# Patient Record
Sex: Female | Born: 1955 | Race: White | Hispanic: No | Marital: Married | Smoking: Never smoker
Health system: Southern US, Community
[De-identification: ages and names within clinical notes are randomized; demographics above are authoritative.]

## PROBLEM LIST (undated history)

## (undated) DIAGNOSIS — Z8619 Personal history of other infectious and parasitic diseases: Secondary | ICD-10-CM

## (undated) DIAGNOSIS — Z9289 Personal history of other medical treatment: Secondary | ICD-10-CM

## (undated) DIAGNOSIS — Z8639 Personal history of other endocrine, nutritional and metabolic disease: Secondary | ICD-10-CM

## (undated) DIAGNOSIS — Z862 Personal history of diseases of the blood and blood-forming organs and certain disorders involving the immune mechanism: Secondary | ICD-10-CM

## (undated) DIAGNOSIS — N200 Calculus of kidney: Secondary | ICD-10-CM

## (undated) DIAGNOSIS — E039 Hypothyroidism, unspecified: Secondary | ICD-10-CM

## (undated) HISTORY — PX: ABDOMINAL HYSTERECTOMY: SHX81

## (undated) HISTORY — PX: PARTIAL HYSTERECTOMY: SHX80

## (undated) HISTORY — DX: Personal history of other medical treatment: Z92.89

## (undated) HISTORY — DX: Personal history of other endocrine, nutritional and metabolic disease: Z86.39

## (undated) HISTORY — DX: Personal history of diseases of the blood and blood-forming organs and certain disorders involving the immune mechanism: Z86.2

## (undated) HISTORY — DX: Personal history of other infectious and parasitic diseases: Z86.19

---

## 2004-06-21 ENCOUNTER — Ambulatory Visit: Payer: Self-pay | Admitting: Family Medicine

## 2005-03-24 ENCOUNTER — Ambulatory Visit: Payer: Self-pay | Admitting: Family Medicine

## 2005-03-24 ENCOUNTER — Encounter: Payer: Self-pay | Admitting: Family Medicine

## 2005-03-24 ENCOUNTER — Other Ambulatory Visit: Admission: RE | Admit: 2005-03-24 | Discharge: 2005-03-24 | Payer: Self-pay | Admitting: Family Medicine

## 2005-03-24 LAB — CONVERTED CEMR LAB: Pap Smear: NORMAL

## 2005-04-08 ENCOUNTER — Ambulatory Visit: Payer: Self-pay | Admitting: Family Medicine

## 2005-06-21 ENCOUNTER — Ambulatory Visit: Payer: Self-pay | Admitting: Family Medicine

## 2005-08-02 ENCOUNTER — Ambulatory Visit: Payer: Self-pay | Admitting: Family Medicine

## 2005-08-05 ENCOUNTER — Ambulatory Visit: Payer: Self-pay | Admitting: Family Medicine

## 2005-08-08 ENCOUNTER — Ambulatory Visit: Payer: Self-pay | Admitting: Family Medicine

## 2006-04-13 ENCOUNTER — Ambulatory Visit: Payer: Self-pay | Admitting: Family Medicine

## 2006-04-13 LAB — CONVERTED CEMR LAB
Basophils Relative: 1.3 % — ABNORMAL HIGH (ref 0.0–1.0)
CO2: 31 meq/L (ref 19–32)
Chloride: 106 meq/L (ref 96–112)
Cholesterol: 267 mg/dL (ref 0–200)
Creatinine, Ser: 1.1 mg/dL (ref 0.4–1.2)
Direct LDL: 173 mg/dL
Eosinophils Relative: 1.4 % (ref 0.0–5.0)
Glucose, Bld: 87 mg/dL (ref 70–99)
HCT: 36.3 % (ref 36.0–46.0)
Hemoglobin: 12.2 g/dL (ref 12.0–15.0)
Lymphocytes Relative: 31.5 % (ref 12.0–46.0)
Monocytes Absolute: 0.4 10*3/uL (ref 0.2–0.7)
Monocytes Relative: 6.9 % (ref 3.0–11.0)
Neutro Abs: 2.9 10*3/uL (ref 1.4–7.7)
Neutrophils Relative %: 58.9 % (ref 43.0–77.0)
Phosphorus: 3.7 mg/dL (ref 2.3–4.6)
Sodium: 140 meq/L (ref 135–145)
TSH: >100 microintl units/mL — ABNORMAL HIGH (ref 0.35–5.50)
VLDL: 33 mg/dL (ref 0–40)
WBC: 5.1 10*3/uL (ref 4.5–10.5)

## 2006-07-06 ENCOUNTER — Encounter: Payer: Self-pay | Admitting: Family Medicine

## 2006-07-06 DIAGNOSIS — E039 Hypothyroidism, unspecified: Secondary | ICD-10-CM | POA: Insufficient documentation

## 2006-07-06 DIAGNOSIS — E785 Hyperlipidemia, unspecified: Secondary | ICD-10-CM | POA: Insufficient documentation

## 2006-12-29 ENCOUNTER — Telehealth: Payer: Self-pay | Admitting: Family Medicine

## 2007-02-27 ENCOUNTER — Ambulatory Visit: Payer: Self-pay | Admitting: Family Medicine

## 2007-03-23 ENCOUNTER — Telehealth: Payer: Self-pay | Admitting: Family Medicine

## 2007-05-01 ENCOUNTER — Telehealth: Payer: Self-pay | Admitting: Family Medicine

## 2007-08-10 ENCOUNTER — Telehealth: Payer: Self-pay | Admitting: Family Medicine

## 2007-12-28 ENCOUNTER — Ambulatory Visit: Payer: Self-pay | Admitting: Family Medicine

## 2007-12-31 LAB — CONVERTED CEMR LAB
HDL: 60.8 mg/dL (ref >39.0)
Triglycerides: 68 mg/dL (ref 0–149)

## 2009-03-11 ENCOUNTER — Telehealth: Payer: Self-pay | Admitting: Family Medicine

## 2009-04-10 ENCOUNTER — Telehealth: Payer: Self-pay | Admitting: Family Medicine

## 2009-04-20 ENCOUNTER — Other Ambulatory Visit: Admission: RE | Admit: 2009-04-20 | Discharge: 2009-04-20 | Payer: Self-pay | Admitting: Family Medicine

## 2009-04-20 ENCOUNTER — Ambulatory Visit: Payer: Self-pay | Admitting: Family Medicine

## 2009-04-22 ENCOUNTER — Encounter (INDEPENDENT_AMBULATORY_CARE_PROVIDER_SITE_OTHER): Payer: Self-pay | Admitting: *Deleted

## 2009-04-22 LAB — CONVERTED CEMR LAB
ALT: 25 units/L (ref 0–35)
BUN: 13 mg/dL (ref 6–23)
Basophils Relative: 1.2 % (ref 0.0–3.0)
Bilirubin, Direct: 0.1 mg/dL (ref 0.0–0.3)
CO2: 30 meq/L (ref 19–32)
Calcium: 9.1 mg/dL (ref 8.4–10.5)
Chloride: 105 meq/L (ref 96–112)
Cholesterol: 179 mg/dL (ref 0–200)
Creatinine, Ser: 0.6 mg/dL (ref 0.4–1.2)
Eosinophils Relative: 2.3 % (ref 0.0–5.0)
HDL: 45.6 mg/dL (ref >39.00)
LDL Cholesterol: 113 mg/dL — ABNORMAL HIGH (ref 0–99)
MCV: 83.2 fL (ref 78.0–100.0)
Monocytes Relative: 5.7 % (ref 3.0–12.0)
Neutrophils Relative %: 67.4 % (ref 43.0–77.0)
Platelets: 334 10*3/uL (ref 150.0–400.0)
RBC: 4.07 M/uL (ref 3.87–5.11)
TSH: 0.03 microintl units/mL — ABNORMAL LOW (ref 0.35–5.50)
Total Bilirubin: 0.3 mg/dL (ref 0.3–1.2)
Total CHOL/HDL Ratio: 4
Total Protein: 6.7 g/dL (ref 6.0–8.3)
Triglycerides: 103 mg/dL (ref 0.0–149.0)
WBC: 6.6 10*3/uL (ref 4.5–10.5)

## 2009-04-29 ENCOUNTER — Ambulatory Visit: Payer: Self-pay | Admitting: Family Medicine

## 2009-05-01 ENCOUNTER — Encounter (INDEPENDENT_AMBULATORY_CARE_PROVIDER_SITE_OTHER): Payer: Self-pay | Admitting: *Deleted

## 2009-05-01 LAB — CONVERTED CEMR LAB
Fecal Occult Bld: NEGATIVE
Pap Smear: NEGATIVE

## 2009-05-26 ENCOUNTER — Ambulatory Visit: Payer: Self-pay | Admitting: Family Medicine

## 2009-05-26 ENCOUNTER — Encounter: Payer: Self-pay | Admitting: Family Medicine

## 2009-06-01 ENCOUNTER — Encounter (INDEPENDENT_AMBULATORY_CARE_PROVIDER_SITE_OTHER): Payer: Self-pay | Admitting: *Deleted

## 2009-07-13 ENCOUNTER — Ambulatory Visit: Payer: Self-pay | Admitting: Family Medicine

## 2009-07-13 LAB — CONVERTED CEMR LAB: Retic Ct Pct: 0.5 % (ref 0.4–3.1)

## 2009-07-15 ENCOUNTER — Encounter: Payer: Self-pay | Admitting: Family Medicine

## 2009-07-15 LAB — CONVERTED CEMR LAB
AST: 36 units/L (ref 0–37)
Albumin: 4.3 g/dL (ref 3.5–5.2)
Alkaline Phosphatase: 112 units/L (ref 39–117)
Basophils Absolute: 0 10*3/uL (ref 0.0–0.1)
Basophils Relative: 0.3 % (ref 0.0–3.0)
Eosinophils Absolute: 0.2 10*3/uL (ref 0.0–0.7)
Folate: 16.7 ng/mL
Iron: 118 ug/dL (ref 42–145)
Lymphocytes Relative: 29.3 % (ref 12.0–46.0)
MCHC: 33.7 g/dL (ref 30.0–36.0)
Monocytes Relative: 7.1 % (ref 3.0–12.0)
Neutrophils Relative %: 59.6 % (ref 43.0–77.0)
RBC: 4.33 M/uL (ref 3.87–5.11)
RDW: 14.5 % (ref 11.5–14.6)
Total Protein: 6.6 g/dL (ref 6.0–8.3)
Vitamin B-12: 850 pg/mL (ref 211–911)

## 2010-03-18 ENCOUNTER — Ambulatory Visit
Admission: RE | Admit: 2010-03-18 | Discharge: 2010-03-18 | Payer: Self-pay | Source: Home / Self Care | Attending: Family Medicine | Admitting: Family Medicine

## 2010-03-22 LAB — CONVERTED CEMR LAB: TSH: 0.02 microintl units/mL — ABNORMAL LOW (ref 0.35–5.50)

## 2010-04-13 ENCOUNTER — Ambulatory Visit
Admission: RE | Admit: 2010-04-13 | Discharge: 2010-04-13 | Payer: Self-pay | Source: Home / Self Care | Attending: Family Medicine | Admitting: Family Medicine

## 2010-04-13 ENCOUNTER — Other Ambulatory Visit: Payer: Self-pay | Admitting: Family Medicine

## 2010-04-13 DIAGNOSIS — R197 Diarrhea, unspecified: Secondary | ICD-10-CM | POA: Insufficient documentation

## 2010-04-13 LAB — CBC WITH DIFFERENTIAL/PLATELET
Basophils Relative: 0.7 % (ref 0.0–3.0)
Eosinophils Absolute: 0.2 10*3/uL (ref 0.0–0.7)
Lymphs Abs: 2 10*3/uL (ref 0.7–4.0)
MCHC: 33.3 g/dL (ref 30.0–36.0)
MCV: 85.3 fl (ref 78.0–100.0)
Monocytes Absolute: 0.8 10*3/uL (ref 0.1–1.0)
Neutrophils Relative %: 62.6 % (ref 43.0–77.0)
Platelets: 341 10*3/uL (ref 150.0–400.0)
RBC: 4.26 Mil/uL (ref 3.87–5.11)

## 2010-04-13 LAB — BASIC METABOLIC PANEL
BUN: 17 mg/dL (ref 6–23)
CO2: 30 mEq/L (ref 19–32)
Chloride: 106 mEq/L (ref 96–112)
Creatinine, Ser: 0.8 mg/dL (ref 0.4–1.2)

## 2010-04-14 ENCOUNTER — Encounter: Payer: Self-pay | Admitting: Family Medicine

## 2010-04-20 ENCOUNTER — Telehealth: Payer: Self-pay | Admitting: Family Medicine

## 2010-04-20 NOTE — Miscellaneous (Signed)
  Clinical Lists Changes  Observations: Added new observation of MAMMO DUE: 05/27/2010 (05/26/2009 14:24) Added new observation of MAMMOGRAM: Normal (05/26/2009 14:24)

## 2010-04-20 NOTE — Assessment & Plan Note (Signed)
Summary: CPX/CLE   Vital Signs:  Patient profile:   55 year old female Height:      63 inches Weight:      121 pounds BMI:     21.51 Temp:     97.9 degrees F oral Pulse rate:   76 / minute Pulse rhythm:   regular BP sitting:   110 / 68  (left arm) Cuff size:   regular  Vitals Entered By: Lowella Petties CMA (April 20, 2009 2:39 PM) CC: 30 minute check up   History of Present Illness: here for health mt exam and to follow chronic med problems  is doing well overall   had a bad cold -- did get over it   wt is up 5 lb with bmi of 21  thyroid is ok  feels like that is just fine without change   lipids due for check Last Lipid ProfileCholesterol: 233 (12/28/2007 9:44:00 AM)HDL:  60.8 (12/28/2007 9:44:00 AM)LDL:  DEL (12/28/2007 9:44:00 AM)Triglycerides:  Last Liver profileSGOT:  SPGT:  T. Bili:  Alk Phos:     diet -- is not great -- is good with vegetables -- too much fast food , knows she needs to eat less   mamd 5/07 due for it no changes on self exam  pap 07 partial hyst for bleeding in past  no gyn symptoms at all   stool cards in 07  not interested in colonoscopy  Td 05  does not get a flu shot   is fairly good with exercise most of the time-- very physical job    Allergies: No Known Drug Allergies  Past History:  Past Medical History: Last updated: 07/06/2006 Hypothyroidism Hyperlipidemia hep A as a child  Past Surgical History: Last updated: 07/06/2006 hyst. part. 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 Eyes:   Denies blurring and eye pain. CV:  Denies chest pain or discomfort, lightheadness, and palpitations. Resp:  Denies cough and wheezing. GI:  Denies abdominal pain, bloody stools, change in bowel habits, and indigestion. GU:  Denies abnormal vaginal bleeding, discharge, dysuria, and hematuria. MS:  Denies joint pain and low back pain. Derm:  Denies itching, lesion(s), poor wound healing, and rash. Neuro:  Denies numbness and tingling. Psych:  Denies anxiety and depression. Endo:  Denies cold intolerance, excessive thirst, excessive urination, and heat intolerance. Heme:  Denies abnormal bruising and bleeding.  Physical Exam  General:  Well-developed,well-nourished,in no acute distress; alert,appropriate and cooperative throughout examination Head:  normocephalic, atraumatic, and no abnormalities observed.   Eyes:  vision grossly intact, pupils equal, pupils round, and pupils reactive to light.  no conjunctival pallor, injection or icterus  Ears:  R ear normal and L ear normal.   Nose:  no nasal discharge.   Mouth:  pharynx pink and moist.   Neck:  supple with full rom and no masses or thyromegally, no JVD or carotid bruit  Chest Wall:  No deformities, masses, or tenderness noted. Breasts:  No mass, nodules, thickening, tenderness, bulging, retraction, inflamation, nipple discharge or skin changes noted.   Lungs:  Normal respiratory effort, chest expands symmetrically. Lungs are clear to  auscultation, no crackles or wheezes. Heart:  Normal rate and regular rhythm. S1 and S2 normal without gallop, murmur, click, rub or other extra sounds. Abdomen:  Bowel sounds positive,abdomen soft and non-tender without masses, organomegaly or hernias noted. no renal bruits  Genitalia:  normal introitus, no external lesions, no vaginal discharge, mucosa pink and moist, no vaginal or cervical lesions, and no friaility or hemorrhage.  uterus is surgically absent  Msk:  No deformity or scoliosis noted of  thoracic or lumbar spine.  no acute joint changes Pulses:  R and L carotid,radial,femoral,dorsalis pedis and posterior tibial pulses are full and equal bilaterally Extremities:  No clubbing, cyanosis, edema, or deformity noted with normal full range of motion of all joints.   Neurologic:  sensation intact to light touch, gait normal, and DTRs symmetrical and normal.   Skin:  Intact without suspicious lesions or rashes Cervical Nodes:  No lymphadenopathy noted Axillary Nodes:  No palpable lymphadenopathy Inguinal Nodes:  No significant adenopathy Psych:  normal affect, talkative and pleasant    Impression & Recommendations:  Problem # 1:  HEALTH MAINTENANCE EXAM (ICD-V70.0) Assessment Comment Only reviewed health habits including diet, exercise and skin cancer prevention reviewed health maintenance list and family history lab today Orders: TLB-Lipid Panel (80061-LIPID) TLB-BMP (Basic Metabolic Panel-BMET) (80048-METABOL) TLB-Hepatic/Liver Function Pnl (80076-HEPATIC) TLB-CBC Platelet - w/Differential (85025-CBCD)  Problem # 2:  ROUTINE GYNECOLOGICAL EXAMINATION (ICD-V72.31) Assessment: Comment Only exam done/ staus post hyst  if nl - needs no further pap smears   Problem # 3:  OTHER SCREENING MAMMOGRAM (ICD-V76.12) Assessment: Comment Only annual mammogram scheduled adv pt to continue regular self breast exams non remarkable breast exam today  Orders: Radiology Referral (Radiology)  Problem # 4:  HYPOTHYROIDISM (ICD-244.9) Assessment: Unchanged  no clinical changes med refil  lab today and adv  Her updated medication list for this problem includes:    Synthroid 150 Mcg Tabs (Levothyroxine sodium) ..... One by mouth daily  Orders: TLB-Lipid Panel (80061-LIPID) TLB-BMP (Basic Metabolic Panel-BMET) (80048-METABOL) TLB-Hepatic/Liver Function Pnl (80076-HEPATIC) TLB-TSH (Thyroid Stimulating Hormone) (84443-TSH) TLB-CBC Platelet - w/Differential (85025-CBCD) Radiology  Referral (Radiology)  Labs Reviewed: TSH: 5.00 (12/28/2007)    Chol: 233 (12/28/2007)   HDL: 60.8 (12/28/2007)   LDL: DEL (12/28/2007)   TG: 68 (12/28/2007)  Problem # 5:  HYPERLIPIDEMIA (ICD-272.4) Assessment: Unchanged  disc lower sat fat diet and risks of high chol lab today and adv  Orders: TLB-Lipid Panel (80061-LIPID) TLB-BMP (Basic Metabolic Panel-BMET) (80048-METABOL) TLB-Hepatic/Liver Function Pnl (80076-HEPATIC) TLB-CBC Platelet - w/Differential (85025-CBCD)    HDL:60.8 (12/28/2007), 50.6 (04/13/2006)  LDL:DEL (12/28/2007), DEL (04/13/2006)  Chol:233 (12/28/2007), 267 (04/13/2006)  Trig:68 (12/28/2007), 167 (04/13/2006)  Problem # 6:  SPECIAL SCREENING FOR OSTEOPOROSIS (ICD-V82.81) Assessment: Comment Only pt is hypothyroid and post men sched dexa rev rec for ca and D Orders: Radiology Referral (Radiology)  Complete Medication List: 1)  Multivitamins Tabs (Multiple vitamin) .... One by mouth daily 2)  Synthroid 150 Mcg Tabs (Levothyroxine sodium) .... One by mouth daily  Patient Instructions: 1)  we will schedule mammogram at check out / also bone density test  2)  please do stool card for colon cancer screening  3)  you can raise your HDL (good cholesterol) by increasing exercise and eating omega 3 fatty acid supplement like fish oil or flax seed oil over the counter 4)  you can lower LDL (bad cholesterol) by limiting saturated fats in diet like red meat, fried foods, egg yolks, fatty breakfast meats, high fat dairy products and  shellfish  5)  labs today  6)  the current recommendation for calcium intake is 1200-1500 mg daily with 1000 IU of vitamin D  Prescriptions: SYNTHROID 150 MCG TABS (LEVOTHYROXINE SODIUM) one by mouth daily  #90 x 3   Entered and Authorized by:   Judith Part MD   Signed by:   Judith Part MD on 04/20/2009   Method used:   Electronically to        [facility] [address] (retail)       [address]        [place], [place]  [address]       Ph: [phone]       Fax: [phone]   RxID:   [id number]   Prior Medications (reviewed today): MULTIVITAMINS  TABS (MULTIPLE VITAMIN) one by mouth daily SYNTHROID 150 MCG TABS (LEVOTHYROXINE SODIUM) one by mouth daily Current Allergies: No known allergies

## 2010-04-20 NOTE — Miscellaneous (Signed)
Summary: med list update- synthroid  Clinical Lists Changes  Medications: Changed medication from SYNTHROID 150 MCG TABS (LEVOTHYROXINE SODIUM) one by mouth daily to SYNTHROID 125 MCG TABS (LEVOTHYROXINE SODIUM) take one by mouth daily     Prior Medications: MULTIVITAMINS  TABS (MULTIPLE VITAMIN) one by mouth daily SYNTHROID 125 MCG TABS (LEVOTHYROXINE SODIUM) take one by mouth daily Current Allergies: No known allergies

## 2010-04-20 NOTE — Progress Notes (Signed)
Summary: cough, achy, wheezing   Phone Note Call from Patient Call back at [phone]   Caller: Patient Call For: Judith Part MD Summary of Call: Pt has productive cough with green mucus, some wheezing but no trouble getting her breath. Pt is very achy in afternoon for 2 weeks and has been taking Aleve. Pt not sure if fever and has drainage at back of throat but no sorethroat. Pt has appt for CPX on 04/20/09 schedueld with Dr.Chyann Ambrocio and would rather not come in unless absolutely necessary. Pt would like med called in to [facility] [phone]. Please advise.  Initial call taken by: Lewanda Rife LPN,  April 10, 2009 11:13 AM  Follow-up for Phone Call        update me if high fever or sob I recommend mucinex DM for cough/ nasal saline spray and lots of fluids  if worse- give inst to get into sat clinic  Follow-up by: Judith Part MD,  April 10, 2009 12:11 PM  Additional Follow-up for Phone Call Additional follow up Details #1::        Advised pt. Additional Follow-up by: Lowella Petties CMA,  April 10, 2009 12:13 PM

## 2010-04-20 NOTE — Miscellaneous (Signed)
Summary: Synthroid rx  Clinical Lists Changes  Medications: Added new medication of SYNTHROID 125 MCG TABS (LEVOTHYROXINE SODIUM) Take 1 tablet by mouth once a day - Signed Removed medication of SYNTHROID 150 MCG TABS (LEVOTHYROXINE SODIUM) Take 1 tablet by mouth once a day Rx of SYNTHROID 125 MCG TABS (LEVOTHYROXINE SODIUM) Take 1 tablet by mouth once a day;  #30 x 3;  Signed;  Entered by: Lewanda Rife LPN;  Authorized by: Judith Part MD;  Method used: Electronically to [facility] [address], [place], [place], [place]  [address], Ph: [phone], Fax: [phone]    Prescriptions: SYNTHROID 125 MCG TABS (LEVOTHYROXINE SODIUM) Take 1 tablet by mouth once a day  #30 x 3   Entered by:   Lewanda Rife LPN   Authorized by:   Judith Part MD   Signed by:   Lewanda Rife LPN on 39/76/7341   Method used:   Electronically to        [facility] [address] (retail)       [address]       [place], [place]  [address]       Ph: [phone]       Fax: [phone]   RxID:   [id number]  Pt will call back to schedule lab  in 6 weeks.Lewanda Rife LPN  July 15, 2009 4:35 PM  Current Allergies: No known allergies

## 2010-04-20 NOTE — Letter (Signed)
Summary: Results Follow up Letter  [place] at [facility]  [address] [place], [place] [address]   Phone: [phone]  Fax: [phone]    05/01/2009 MRN: [id number]    [facility] [address] [place], [place]  [address]    Dear Ms. Battisti,  The following are the results of your recent test(s):  Test         Result    Pap Smear:        Normal _____  Not Normal _____ Comments: ______________________________________________________ Cholesterol: LDL(Bad cholesterol):         Your goal is less than:         HDL (Good cholesterol):       Your goal is more than: Comments:  ______________________________________________________ Mammogram:        Normal _____  Not Normal _____ Comments:  ___________________________________________________________________ Hemoccult:        Normal __X___  Not normal _______ Comments:     We will discuss this further at your follow up visit.  _____________________________________________________________________ Other Tests:    We routinely do not discuss normal results over the telephone.  If you desire a copy of the results, or you have any questions about this information we can discuss them at your next office visit.   Sincerely,    Marne A. Milinda Antis, M.D.  MAT:lsf

## 2010-04-20 NOTE — Letter (Signed)
Summary: Results Follow up Letter  [place] at [facility]  [address] [place], [place] [address]   Phone: [phone]  Fax: [phone]    05/01/2009 MRN: [id number]    [facility] [address] [place], [place]  [address]    Dear Ms. Gillian,  The following are the results of your recent test(s):  Test         Result    Pap Smear:        Normal __X___  Not Normal _____ Comments: ______________________________________________________ Cholesterol: LDL(Bad cholesterol):         Your goal is less than:         HDL (Good cholesterol):       Your goal is more than: Comments:  ______________________________________________________ Mammogram:        Normal _____  Not Normal _____ Comments:  ___________________________________________________________________ Hemoccult:        Normal _____  Not normal _______ Comments:    _____________________________________________________________________ Other Tests:    We routinely do not discuss normal results over the telephone.  If you desire a copy of the results, or you have any questions about this information we can discuss them at your next office visit.   Sincerely,    Marne A. Milinda Antis, M.D.  MAT:lsf

## 2010-04-20 NOTE — Letter (Signed)
Summary: Results Follow up Letter  [place] at [facility]  [address] [place], [place] [address]   Phone: [phone]  Fax: [phone]    06/01/2009 MRN: [id number]    [facility] [address] [place], [place]  [address]    Dear Ms. Oneil,  The following are the results of your recent test(s):  Test         Result    Pap Smear:        Normal _____  Not Normal _____ Comments: ______________________________________________________ Cholesterol: LDL(Bad cholesterol):         Your goal is less than:         HDL (Good cholesterol):       Your goal is more than: Comments:  ______________________________________________________ Mammogram:        Normal __X___  Not Normal _____ Comments:  Yearly follow up is recommended.   ___________________________________________________________________ Hemoccult:        Normal _____  Not normal _______ Comments:    _____________________________________________________________________ Other Tests:    We routinely do not discuss normal results over the telephone.  If you desire a copy of the results, or you have any questions about this information we can discuss them at your next office visit.   Sincerely,    Marne A. Milinda Antis, M.D.  MAT:lsf

## 2010-04-20 NOTE — Letter (Signed)
Summary: [facility]: Immunoassay Fecal Occult Blood (iFOB) Order Form  [place] at [facility]  [address] [place], [place] [address]   Phone: [phone]  Fax: [phone]      [facility]: Immunoassay Fecal Occult Blood (iFOB) Order Form   April 20, 2009 MRN: [id number]   CEONNA FRAZZINI 1955-11-04   Physicican Name:___Tower______________________  Diagnosis Code:_____V76.49_____________________      Judith Part MD

## 2010-04-20 NOTE — Assessment & Plan Note (Signed)
Summary: F/U/CLE   Vital Signs:  Patient profile:   55 year old female Weight:      126.50 pounds BMI:     22.49 Temp:     98.2 degrees F oral Pulse rate:   80 / minute Pulse rhythm:   regular BP sitting:   104 / 70  (left arm) Cuff size:   regular  Vitals Entered By: Lewanda Rife LPN (July 13, 2009 3:21 PM) CC: follow up   History of Present Illness: here for f/u of hypothyroid and lipid and anemia   tsh was low in jan --- adv to dec dose and pt did not -- also did not come for 6 wk f/u  needs this checked she is feeling a little more tired than usual  no racing heart or wt loss    chol was fairly good with  LDL 113  mildly anemic with hb 11.3  used to be anemic -- was when pregnant  no menses - due to hyst -- did have to have transfusion with her hyst  has not had colonosc - yet  has been taking iron  does not donate blood   no dietary restrictions   is starting to walk again-- and did 5 K run in march     Allergies (verified): No Known Drug Allergies  Past History:  Past Surgical History: Last updated: 07/06/2006 hyst. part. 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 Eyes:  Denies blurring and eye irritation. CV:  Denies chest pain or  discomfort, palpitations, shortness of breath with exertion, and swelling of feet. Resp:  Denies cough and wheezing. GI:  Denies abdominal pain, bloody stools, change in bowel habits, indigestion, and nausea. GU:  Denies dysuria and urinary frequency. MS:  arms are achey at night knees sometimes hurt after running . Derm:  Denies lesion(s), poor wound healing, and rash. Neuro:  Denies numbness, tingling, and weakness. Psych:  Denies anxiety and depression. Endo:  Denies cold intolerance, excessive thirst, excessive urination, and heat intolerance. Heme:  Denies abnormal bruising and bleeding.  Physical Exam  General:  Well-developed,well-nourished,in no acute distress; alert,appropriate and cooperative throughout examination Head:  normocephalic, atraumatic, and no abnormalities observed.   Eyes:  vision grossly intact, pupils equal, pupils round, and pupils reactive to light.  no conjunctival pallor, injection or icterus  Mouth:  pharynx pink and moist.   Neck:  supple with full rom and no masses or thyromegally, no JVD or carotid bruit  Lungs:  Normal respiratory effort, chest expands symmetrically. Lungs are clear to auscultation, no crackles or wheezes. Heart:  Normal rate and regular rhythm. S1 and S2 normal without gallop, murmur, click, rub or other extra sounds. Msk:  No deformity or scoliosis noted of thoracic or lumbar spine.  no acute joint changes  Extremities:  No clubbing, cyanosis, edema, or deformity noted with normal  full range of motion of all joints.   Neurologic:  sensation intact to light touch, gait normal, and DTRs symmetrical and normal.  no tremor  Skin:  Intact without suspicious lesions or rashes Cervical Nodes:  No lymphadenopathy noted Psych:  nl affect  no pressured speech or signs of anx   Impression & Recommendations:  Problem # 1:  UNSPECIFIED ANEMIA (ICD-285.9) Assessment New pt is strongly resistant to screen colonoscopy  given 3 heme cards  (immunoassay in past nl ) has intermittent hx of anemia  labs today and adv  Orders: Venipuncture (32951) TLB-CBC Platelet - w/Differential (85025-CBCD) TLB-TSH (Thyroid Stimulating Hormone) (84443-TSH) TLB-B12 + Folate Pnl (88416_60630-Z60/FUX) TLB-IBC Pnl (Iron/FE;Transferrin) (83550-IBC) TLB-Ferritin (82728-FER) T-Reticulocyte Count, Automated (32355-73220) TLB-Hepatic/Liver Function Pnl (80076-HEPATIC) Specimen Handling (25427)  Problem # 2:  HYPOTHYROIDISM (ICD-244.9) Assessment: Deteriorated  pt did not dec dose as inst  lab today -- then will adv CW:CBJS change overall feels a little tired -but no other clinical changes  The following medications were removed from the medication list:    Synthroid 125 Mcg Tabs (Levothyroxine sodium) .Marland Kitchen... Take one by mouth daily Her updated medication list for this problem includes:    Synthroid 150 Mcg Tabs (Levothyroxine sodium) .Marland Kitchen... Take 1 tablet by mouth once a day  Orders: Venipuncture (28[address]) TLB-CBC Platelet - w/Differential (85025-CBCD) TLB-TSH (Thyroid Stimulating Hormone) (84443-TSH) TLB-B12 + Folate Pnl (17616_07371-G62/IRS) TLB-IBC Pnl (Iron/FE;Transferrin) (83550-IBC) TLB-Ferritin (82728-FER) T-Reticulocyte Count, Automated (85462-70350)  Labs Reviewed: TSH: 0.03 (04/20/2009)    Chol: 179 (04/20/2009)   HDL: 45.60 (04/20/2009)   LDL: 113 (04/20/2009)   TG: 103.0 (04/20/2009)  Complete Medication List: 1)  Multivitamins Tabs (Multiple vitamin) .... One by mouth daily 2)  Calcium-vitamin D 500-125 Mg-unit Tabs (Calcium-vitamin d) .... Take 1 tablet by mouth once a day 3)  Potassium 99 Mg Tabs (Potassium) .... Take 1 tablet by mouth once a day (otc) 4)  Synthroid 150 Mcg Tabs (Levothyroxine sodium) .... Take 1 tablet by mouth once a day  Patient Instructions: 1)  labs today 2)  I will advise you further in terms of medications when I get them back  3)  no iron supplement for now  4)  do a set of 3 heme cards  5)  let  me know when you are ready for screening colonoscopy   Current Allergies (reviewed today): No known allergies

## 2010-04-22 NOTE — Assessment & Plan Note (Signed)
Summary: diarrhea x 4 weeks/alc   Vital Signs:  Patient profile:   55 year old female Height:      63 inches Weight:      113.75 pounds BMI:     20.22 Temp:     98.1 degrees F oral Pulse rate:   80 / minute Pulse rhythm:   regular BP sitting:   100 / 64  (right arm) Cuff size:   regular  Vitals Entered By: Lewanda Rife LPN (April 13, 2010 8:15 AM) CC: diarrhea x 4 wks and pt feels weak.    History of Present Illness: here for diarrhea- for about 4 weeks  has urgency to have bm right after she eats  some muscle cramping  stool is really watery  some hemorroidal bleeding -- little with wiping  is having 8-10 bm per day is under some stress - not much more than usual no diet change except occ protien shake afraid to eat because of diarrhea  2 episodes of nausea - did not throw up  no abd pain  not a lot of abd cramping  no fever   nothing out of the ordinary for diet -- ? what she last ate  had zpack before x mas  wt is down 13lb does go to the gym and was thinking about a body building show- was trying to loose wt   tsh was low on 12/29       Allergies (verified): No Known Drug Allergies  Past History:  Past Medical History: Last updated: 07/13/2009 Hypothyroidism Hyperlipidemia hep A as a child anemia  blood transfusion in past   Past Surgical History: Last updated: 07/06/2006 hyst. part. 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 Eyes:   Denies blurring. CV:  Denies chest pain or discomfort, lightheadness, palpitations, and shortness of breath with exertion. Resp:  Denies cough and shortness of breath. GI:  Complains of change in bowel habits, diarrhea, gas, and hemorrhoids; denies indigestion and vomiting. GU:  Denies dysuria and urinary frequency. Derm:  Denies itching, lesion(s), poor wound healing, and rash. Neuro:  Denies numbness and tingling. Psych:  is stressed . Endo:  Denies cold intolerance, excessive thirst, excessive urination, and heat intolerance. Heme:  Denies abnormal bruising and bleeding.  Physical Exam  General:  slim and well appearing - wt loss noted  Head:  normocephalic, atraumatic, and no abnormalities observed.   Eyes:  vision grossly intact, pupils equal, pupils round, and pupils reactive to light.  no conjunctival pallor, injection or icterus  Mouth:  pharynx pink and moist.   Neck:  supple with full rom and no masses or thyromegally, no JVD or carotid bruit  Lungs:  Normal respiratory effort, chest expands symmetrically. Lungs are clear to auscultation, no crackles or wheezes. Heart:  Normal rate and regular rhythm. S1 and S2 normal without gallop, murmur, click, rub or other extra sounds. Abdomen:  Bowel sounds positive,abdomen soft and non-tender without masses, organomegaly or hernias noted. Msk:  No deformity or scoliosis noted of thoracic or lumbar spine.  no acute joint changes  Extremities:  No clubbing, cyanosis, edema, or deformity noted with normal full range of motion of all joints.   Neurologic:  gait normal and DTRs symmetrical and normal.   no tremor  Skin:  Intact without suspicious lesions or rashes nl color and turgor (is tanned )  Cervical Nodes:  No lymphadenopathy noted Inguinal Nodes:  No significant adenopathy Psych:  normal affect, talkative and pleasant    Impression & Recommendations:  Problem # 1:  DIARRHEA (ICD-787.91) Assessment New for 4 weeks with urgency  of stools/ watery and wt loss  did have on round of zpak at holidays- test for cdiff also stool cx bmet-- check electrolytes cbc with diff also tsh -- ? over suppl thyroid causing this and wt loss pt not ready for screen colonosc yet but would consider if she had all neg labs also disc poss of IBS will update if worse- pend labs Her updated medication list for this problem includes:    Imodium Advanced 2-125 Mg Tabs (Loperamide-simethicone) ..... Otc as directed.  Orders: Venipuncture (81191) TLB-BMP (Basic Metabolic Panel-BMET) (80048-METABOL) TLB-CBC Platelet - w/Differential (85025-CBCD) T-Culture, C-Diff Toxin A/B (47829-56213) T-Culture, Stool (87045/[id number]) TLB-TSH (Thyroid Stimulating Hormone) (84443-TSH) Specimen Handling (08657)  Problem # 2:  HYPOTHYROIDISM (ICD-244.9) Assessment: Unchanged  has had low tsh -- and recent red in dose re check today could cause diarrhea if over suppl Her updated medication list for this problem includes:    Synthroid 88 Mcg Tabs (Levothyroxine sodium) .Marland Kitchen... 1 by mouth once daily  Labs Reviewed: TSH: 0.02 (03/18/2010)    Chol: 179 (04/20/2009)   HDL: 45.60 (04/20/2009)   LDL: 113 (04/20/2009)   TG: 103.0 (04/20/2009)  Orders: Venipuncture (84696) TLB-BMP (Basic Metabolic Panel-BMET) (80048-METABOL) TLB-CBC Platelet - w/Differential (85025-CBCD) T-Culture, C-Diff Toxin A/B (29528-41324) T-Culture, Stool (87045/87046-70140) TLB-TSH (Thyroid Stimulating Hormone) (84443-TSH)  Complete Medication List: 1)  Multivitamins Tabs (Multiple vitamin) .... One by mouth daily 2)  Calcium-vitamin D 500-125 Mg-unit Tabs (Calcium-vitamin d) .... Take 1 tablet by mouth once a day 3)  Potassium 99 Mg Tabs (Potassium) .... Take 1 tablet by mouth once a day (otc) 4)  Synthroid 88 Mcg Tabs (Levothyroxine sodium) .Marland Kitchen.. 1 by mouth once daily 5)  Niacin 100 Mg Tabs (Niacin) .... Take 1 tablet by mouth once a day 6)  Imodium Advanced 2-125 Mg Tabs  (Loperamide-simethicone) .... Otc as directed.  Patient Instructions: 1)  labs and stool tests today 2)  stick a bland diet  3)  drink lots of fluids  4)  if worse let me know  5)  if all negative labs - need to consider GI referral / possibly colonoscopy (which you need for screening anyway)    Orders Added: 1)  Venipuncture [36415] 2)  TLB-BMP (Basic Metabolic Panel-BMET) [80048-METABOL] 3)  TLB-CBC Platelet - w/Differential [85025-CBCD] 4)  T-Culture, C-Diff Toxin A/B [[id number]] 5)  T-Culture, Stool [87045/[id number]] 6)  TLB-TSH (Thyroid Stimulating Hormone) [84443-TSH] 7)  Specimen Handling [99000] 8)  Est. Patient Level IV [64403]    Current Allergies (reviewed today): No known allergies

## 2010-04-28 NOTE — Progress Notes (Signed)
Summary: still has diarrhea  Phone Note Call from Patient Call back at Home Phone [phone] Call back at [phone]   Caller: Patient Call For: Judith Part MD Summary of Call: Advised pt of stool culture results.  She still has frequent diarrhea.  She says she has tried a probiotic before and it caused her to itch.  She says she can try one again if you think she should. Initial call taken by: Lowella Petties CMA, AAMA,  April 20, 2010 9:30 AM  Follow-up for Phone Call        try eating yogurt if she tolerates dairy ok  also I want to ref to GI  will do ref for [facility] Follow-up by: Judith Part MD,  April 20, 2010 10:39 AM  Additional Follow-up for Phone Call Additional follow up Details #1::        Patient notified as instructed by telephone. Pt will wait to hear from [facility] and pt can be reached at work # [phone] or cell [phone].Lewanda Rife LPN  April 20, 2010 10:44 AM     Additional Follow-up for Phone Call Additional follow up Details #2::    Spoke with patient. She now wants to call her insurance to see if they pay for her to see a specialist, and she will call me back. Follow-up by: Carlton Adam,  April 20, 2010 12:19 PM  Additional Follow-up for Phone Call Additional follow up Details #3:: Details for Additional Follow-up Action Taken: Appt made with Fransico Setters at 9:00am at [facility] Additional Follow-up by: Carlton Adam,  April 23, 2010 3:06 PM

## 2010-05-13 ENCOUNTER — Other Ambulatory Visit (INDEPENDENT_AMBULATORY_CARE_PROVIDER_SITE_OTHER): Payer: No Typology Code available for payment source

## 2010-05-13 ENCOUNTER — Other Ambulatory Visit: Payer: Self-pay | Admitting: Family Medicine

## 2010-05-13 ENCOUNTER — Encounter (INDEPENDENT_AMBULATORY_CARE_PROVIDER_SITE_OTHER): Payer: Self-pay | Admitting: *Deleted

## 2010-05-13 DIAGNOSIS — D649 Anemia, unspecified: Secondary | ICD-10-CM

## 2010-05-13 DIAGNOSIS — E785 Hyperlipidemia, unspecified: Secondary | ICD-10-CM

## 2010-05-13 DIAGNOSIS — E039 Hypothyroidism, unspecified: Secondary | ICD-10-CM

## 2010-05-13 LAB — CBC WITH DIFFERENTIAL/PLATELET
Basophils Absolute: 0 10*3/uL (ref 0.0–0.1)
Basophils Relative: 0.6 % (ref 0.0–3.0)
Eosinophils Absolute: 0.3 10*3/uL (ref 0.0–0.7)
Lymphocytes Relative: 46.4 % — ABNORMAL HIGH (ref 12.0–46.0)
MCHC: 33.8 g/dL (ref 30.0–36.0)
Neutrophils Relative %: 39.1 % — ABNORMAL LOW (ref 43.0–77.0)
Platelets: 318 10*3/uL (ref 150.0–400.0)
RBC: 4.56 Mil/uL (ref 3.87–5.11)

## 2010-05-13 LAB — BASIC METABOLIC PANEL
BUN: 16 mg/dL (ref 6–23)
CO2: 29 mEq/L (ref 19–32)
Calcium: 8.3 mg/dL — ABNORMAL LOW (ref 8.4–10.5)
Chloride: 104 mEq/L (ref 96–112)
Creatinine, Ser: 0.8 mg/dL (ref 0.4–1.2)
GFR: 82.29 mL/min (ref >60.00)
Glucose, Bld: 78 mg/dL (ref 70–99)
Potassium: 4.1 mEq/L (ref 3.5–5.1)
Sodium: 138 mEq/L (ref 135–145)

## 2010-05-13 LAB — LIPID PANEL
Cholesterol: 237 mg/dL — ABNORMAL HIGH (ref 0–200)
HDL: 46.3 mg/dL (ref >39.00)
Total CHOL/HDL Ratio: 5
Triglycerides: 127 mg/dL (ref 0.0–149.0)
VLDL: 25.4 mg/dL (ref 0.0–40.0)

## 2010-05-13 LAB — HEPATIC FUNCTION PANEL
Bilirubin, Direct: 0.1 mg/dL (ref 0.0–0.3)
Total Bilirubin: 0.4 mg/dL (ref 0.3–1.2)

## 2010-05-13 LAB — TSH: TSH: 42.86 u[IU]/mL — ABNORMAL HIGH (ref 0.35–5.50)

## 2010-05-14 ENCOUNTER — Telehealth (INDEPENDENT_AMBULATORY_CARE_PROVIDER_SITE_OTHER): Payer: Self-pay | Admitting: *Deleted

## 2010-05-18 ENCOUNTER — Encounter: Payer: Self-pay | Admitting: Family Medicine

## 2010-05-18 ENCOUNTER — Encounter (INDEPENDENT_AMBULATORY_CARE_PROVIDER_SITE_OTHER): Payer: No Typology Code available for payment source | Admitting: Family Medicine

## 2010-05-18 DIAGNOSIS — E785 Hyperlipidemia, unspecified: Secondary | ICD-10-CM

## 2010-05-18 DIAGNOSIS — Z0000 Encounter for general adult medical examination without abnormal findings: Secondary | ICD-10-CM

## 2010-05-18 DIAGNOSIS — E039 Hypothyroidism, unspecified: Secondary | ICD-10-CM

## 2010-05-18 DIAGNOSIS — Z1231 Encounter for screening mammogram for malignant neoplasm of breast: Secondary | ICD-10-CM

## 2010-05-18 NOTE — Progress Notes (Signed)
----   Converted from flag ---- ---- 05/13/2010 8:03 PM, Judith Part MD wrote: wellness and lipid v70.0 thanks   ---- 05/12/2010 4:14 PM, Mills Koller wrote: This patient is scheduled for CPX with you, I need lab orders for Thursday with dx, please. Thanks, Terri ------------------------------

## 2010-05-27 NOTE — Assessment & Plan Note (Signed)
Summary: CPX/CLE  [employer]   Vital Signs:  Patient profile:   55 year old female Height:      63 inches Weight:      119 pounds BMI:     21.16 Temp:     98.3 degrees F oral Pulse rate:   80 / minute Pulse rhythm:   regular BP sitting:   100 / 64  (left arm) Cuff size:   regular  Vitals Entered By: Lewanda Rife LPN (May 18, 2010 2:35 PM) CC: CPX   History of Present Illness: here for health mt exam and to review chronic health problems   wt is up 6 lb with bmi of 21-- that is good   100/64- good bp  tsh way up after dose decrease last time to 25 micrograms  feels tired and swollen , depressed  she is taking her synthroid  also chol way up -- LDL from 113 to 171 -- big increase -- is not eating differently    part hyst in past 1/11 pap nl   mam nl 3/11 time for it soon self exam- no lumps   Td 05 does not take flu shots  has a head cold and snifflig and some cough  no  fever  since end of last week -- stayed in this weekend   dexa -- insurance does not cover  ca and D-- is taking that for her bones   still having diarrhea  has appt set up with Dr Bluford Kaufmann -- march 13th  thinks she will be open to colonoscopy at this point  also bloated and uncomfortable    Allergies (verified): No Known Drug Allergies  Past History:  Past Medical History: Last updated: 07/13/2009 Hypothyroidism Hyperlipidemia hep A as a child anemia  blood transfusion in past   Past Surgical History: Last updated: 07/06/2006 hyst. part. 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 Eyes:  Denies blurring. CV:  Complains of swelling of feet; denies chest pain or discomfort and shortness of breath with exertion. Resp:  Denies shortness of breath. GI:  Complains of diarrhea and gas; denies abdominal pain. GU:  Denies abnormal vaginal bleeding, discharge, dysuria, and urinary frequency. MS:  Complains of stiffness; denies joint redness, joint swelling, and cramps. Derm:  Denies itching, lesion(s), poor wound healing, and rash. Neuro:  Denies numbness and tingling. Psych:  Complains of depression; denies anxiety and panic attacks. Endo:  Denies cold intolerance, excessive thirst, excessive urination, and heat intolerance.  Physical Exam  General:  slim and fatiged appearing  Head:  normocephalic, atraumatic, and no abnormalities observed.   Eyes:  vision grossly intact, pupils equal, pupils round, and pupils reactive to light.  no conjunctival pallor, injection or icterus  Mouth:  pharynx pink and moist.   Neck:  supple with full rom and no masses or thyromegally, no JVD or carotid bruit  Chest Wall:  R clavicle is more prominent than L - this is baseline  Breasts:  No mass, nodules, thickening, tenderness, bulging, retraction, inflamation, nipple discharge or skin changes noted.   Lungs:  Normal respiratory effort, chest expands symmetrically. Lungs are  clear to auscultation, no crackles or wheezes. Heart:  Normal rate and regular rhythm. S1 and S2 normal without gallop, murmur, click, rub or other extra sounds. Abdomen:  Bowel sounds positive,abdomen soft and non-tender without masses, organomegaly or hernias noted. no renal bruits  Msk:  No deformity or scoliosis noted of thoracic or lumbar spine.  no acute joint changes  Pulses:  R and L carotid,radial,femoral,dorsalis pedis and posterior tibial pulses are full and equal bilaterally Extremities:  no pitting edema   Neurologic:  DTRS are plus one today- somewhat diminished  sensation intact  to light touch and gait normal.   Skin:  Intact without suspicious lesions or rashes Cervical Nodes:  No lymphadenopathy noted Axillary Nodes:  No palpable lymphadenopathy Inguinal Nodes:  No significant adenopathy Psych:  normal affect, talkative and pleasant    Impression & Recommendations:  Problem # 1:  HEALTH MAINTENANCE EXAM (ICD-V70.0) Assessment Comment Only reviewed health habits including diet, exercise and skin cancer prevention reviewed health maintenance list and family history rev labs in detail incl inc cholestero   Problem # 2:  OTHER SCREENING MAMMOGRAM (ICD-V76.12) Assessment: Comment Only annual mammogram scheduled adv pt to continue regular self breast exams non remarkable breast exam today  Orders: Radiology Referral (Radiology)  Problem # 3:  HYPOTHYROIDISM (ICD-244.9) Assessment: Deteriorated  after being oversupplemented for a while - now tsh is very high with lesser dose  we have had a hard time getting her euthyroid  rev labs with pt and high tsh  she is quite symptomatic  will inc dose synth from 25 to 75 micrograms  lab and f/u 1 mo  if not euthyroid at that time I strongly recommend endocrine consult  I also feel she needs dexa given post men and thyroid status -- she will check again with her insurance about this  Her updated medication list for this problem includes:    Synthroid 75 Mcg Tabs (Levothyroxine sodium) .Marland Kitchen... 1 by mouth once daily  Orders: Prescription Created Electronically [id number])  Problem # 4:  HYPERLIPIDEMIA (ICD-272.4) much worse lately without change in diet  ? thyroid related re check 1 mo after change in dose rev her diet for sat fats as well rev labs with pt  Her updated medication list for this problem includes:    Niacin 100 Mg Tabs (Niacin) .Marland Kitchen... Take 1 tablet by mouth once a day  Complete Medication List: 1)  Calcium-vitamin D 500-125 Mg-unit Tabs (Calcium-vitamin d) .... Take 1 tablet by mouth once a day 2)   Potassium 99 Mg Tabs (Potassium) .... Take 1 tablet by mouth once a day (otc) 3)  Synthroid 75 Mcg Tabs (Levothyroxine sodium) .Marland Kitchen.. 1 by mouth once daily 4)  Niacin 100 Mg Tabs (Niacin) .... Take 1 tablet by mouth once a day 5)  Imodium Advanced 2-125 Mg Tabs (Loperamide-simethicone) .... Otc as directed. 6)  Probiotic Caps (Probiotic product) .... Otc as directed. 7)  Liquid Multivitamin (pt Not Sure of Name)  .... Take one ounce by mouth daily  Patient Instructions: 1)  call your insurance about bone density test -- your are at risk for osteoporosis in light of menopause and hypothyroidism 2)  let me know  3)  increase your thyroid supplement from 25 to 75 micrograms daily 4)  follow up with GI as planned 5)  you can try mucinex over the counter twice daily as directed and nasal saline spray for congestion 6)  tylenol over the counter as directed may help with aches,  headache and fever 7)  call if symptoms worsen or if not improved in 7 days 8)  we will refer you for mammogram at check out  9)  you can raise your HDL (good cholesterol) by increasing exercise and eating omega 3 fatty acid supplement like fish oil or flax seed oil over the counter 10)  you can lower LDL (bad cholesterol) by limiting saturated fats in diet like red meat, fried foods, egg yolks, fatty breakfast meats, high fat dairy products and shellfish  11)  schedule fasting labs in 1 month and then follow up with me lipid/ast/alt/ tsh/ calcium  244.9 , 272 Prescriptions: SYNTHROID 75 MCG TABS (LEVOTHYROXINE SODIUM) 1 by mouth once daily  #30 x 11   Entered and Authorized by:   Judith Part MD   Signed by:   Judith Part MD on 05/18/2010   Method used:   Electronically to        [facility] (retail)       [address], [address]       [place], [place]  [address]       Ph: [phone]       Fax: [phone]   RxID:   [id number]    Orders Added: 1)  Radiology  Referral [Radiology] 2)  Prescription Created Electronically [[id number]] 3)  Est. Patient 40-64 years [phone]    Current Allergies (reviewed today): No known allergies

## 2010-05-28 ENCOUNTER — Telehealth: Payer: Self-pay | Admitting: Family Medicine

## 2010-06-08 NOTE — Progress Notes (Signed)
Summary: pt to cancel GI appt  Phone Note Call from Patient   Caller: Patient Call For: Judith Part MD Summary of Call: Pt was referred to GI for colonoscopy but she says her insurance wont pay for this and she cant afford it.  She is going to cancel appt and will reschedule at a later date.             Lowella Petties CMA, AAMA  May 28, 2010 3:27 PM   Follow-up for Phone Call        have her call if she wants to do an immunoassay stool card then, thanks  Follow-up by: Judith Part MD,  May 28, 2010 4:36 PM  Additional Follow-up for Phone Call Additional follow up Details #1::        Patient notified as instructed by telephone. Pt said she did stool card in Jan but does plan to reschedule the colonoscopy.Lewanda Rife LPN  May 31, 2010 4:49 PM

## 2010-06-30 ENCOUNTER — Telehealth: Payer: Self-pay | Admitting: *Deleted

## 2010-06-30 DIAGNOSIS — E039 Hypothyroidism, unspecified: Secondary | ICD-10-CM

## 2010-06-30 NOTE — Telephone Encounter (Signed)
[facility] with a copy of labs that were done while she was there. Her thyroid is very low, she is feeling very fatigued, body and muscle aches. Her TSH was 12.640. Fax is on your desk.  Please advise.

## 2010-07-04 NOTE — Telephone Encounter (Signed)
Please let me know what her thyroid supplement dose is - I don't have access to epic where I am I need to increase it

## 2010-07-05 MED ORDER — LEVOTHYROXINE SODIUM 100 MCG PO TABS
100.0000 ug | ORAL_TABLET | Freq: Every day | ORAL | Status: DC
Start: 2010-07-05 — End: 2012-03-06

## 2010-07-05 NOTE — Telephone Encounter (Signed)
Patient notified as instructed by telephone. Pt will call back for 6 week appt with Dr Milinda Antis. Medication phoned to [facility] [address] [facility] as instructed.

## 2010-07-05 NOTE — Telephone Encounter (Signed)
Thanks - will inc from 75 to 100  Px written for call in   Follow up for visit in 6 weeks with me please

## 2010-07-05 NOTE — Telephone Encounter (Signed)
Spoke with pt and she is presently taking Synthroid 75 mcg taking 1 tablet daily. Pt uses [facility] [address] if pharmacy needed.

## 2010-07-07 ENCOUNTER — Encounter: Payer: Self-pay | Admitting: Family Medicine

## 2010-07-26 ENCOUNTER — Ambulatory Visit: Payer: Self-pay | Admitting: Family Medicine

## 2010-07-26 LAB — HM MAMMOGRAPHY: HM Mammogram: NORMAL

## 2010-07-27 ENCOUNTER — Encounter: Payer: Self-pay | Admitting: Family Medicine

## 2012-03-06 ENCOUNTER — Ambulatory Visit (INDEPENDENT_AMBULATORY_CARE_PROVIDER_SITE_OTHER): Payer: No Typology Code available for payment source | Admitting: Family Medicine

## 2012-03-06 ENCOUNTER — Telehealth: Payer: Self-pay | Admitting: Family Medicine

## 2012-03-06 ENCOUNTER — Encounter: Payer: Self-pay | Admitting: *Deleted

## 2012-03-06 VITALS — BP 128/68 | HR 72 | Temp 98.1°F | Ht 63.0 in | Wt 122.2 lb

## 2012-03-06 DIAGNOSIS — E039 Hypothyroidism, unspecified: Secondary | ICD-10-CM

## 2012-03-06 DIAGNOSIS — E785 Hyperlipidemia, unspecified: Secondary | ICD-10-CM

## 2012-03-06 LAB — COMPREHENSIVE METABOLIC PANEL
ALT: 22 U/L (ref 0–35)
Albumin: 4.2 g/dL (ref 3.5–5.2)
CO2: 25 mEq/L (ref 19–32)
Chloride: 106 mEq/L (ref 96–112)
GFR: 84.28 mL/min (ref >60.00)
Glucose, Bld: 84 mg/dL (ref 70–99)
Potassium: 4 mEq/L (ref 3.5–5.1)
Sodium: 138 mEq/L (ref 135–145)
Total Bilirubin: 0.9 mg/dL (ref 0.3–1.2)
Total Protein: 7 g/dL (ref 6.0–8.3)

## 2012-03-06 LAB — LIPID PANEL
Cholesterol: 236 mg/dL — ABNORMAL HIGH (ref 0–200)
HDL: 63.3 mg/dL
Total CHOL/HDL Ratio: 4
Triglycerides: 65 mg/dL (ref 0.0–149.0)
VLDL: 13 mg/dL (ref 0.0–40.0)

## 2012-03-06 LAB — TSH: TSH: 0.04 u[IU]/mL — ABNORMAL LOW (ref 0.35–5.50)

## 2012-03-06 LAB — LDL CHOLESTEROL, DIRECT: Direct LDL: 163.3 mg/dL

## 2012-03-06 MED ORDER — LEVOTHYROXINE SODIUM 100 MCG PO TABS: 100.0000 ug | ORAL_TABLET | Freq: Every day | ORAL | Status: DC

## 2012-03-06 MED ORDER — LEVOTHYROXINE SODIUM 125 MCG PO TABS: 125.0000 ug | ORAL_TABLET | Freq: Every day | ORAL | Status: DC

## 2012-03-06 NOTE — Patient Instructions (Addendum)
Labs today  I sent medicine in to pharmacy  Avoid red meat/ fried foods/ egg yolks/ fatty breakfast meats/ butter, cheese and high fat dairy/ and shellfish

## 2012-03-06 NOTE — Progress Notes (Signed)
Subjective:    Patient ID: Jean Pena, female    DOB: 08-21-1955, 56 y.o.   MRN: [id number]  HPI Here for f/u of chronic medical problems  Ran out of thyroid med refils She went to endocrinologist -and got med from Dr Tedd Sias  Is feeling good overall   Got over a cold  [facility] still a little congested  Hypothyroidism  Pt has no clinical changes No change in energy level/ hair or skin/ edema and no tremor Lab Results  Component Value Date   TSH 42.86* 05/13/2010    Overdue for labs Last check with endocrine Dr Tedd Sias -- has been a year - she chose not to come  No goiter  Is on synthroid 125 mcg   Hyperlipidemia Lab Results  Component Value Date   CHOL 237* 05/13/2010   HDL 46.30 05/13/2010   LDLCALC 113* 04/20/2009   LDLDIRECT 171.5 05/13/2010   TRIG 127.0 05/13/2010   CHOLHDL 5 05/13/2010   is overdue for check  Diet is not great -- at times is good but really does not watch it  She thinks it is all food related - eats a lot of fast food  She does exercise  She plans to make effort   Flu vaccine- does not have flu shots   Patient Active Problem List  Diagnosis  . HYPOTHYROIDISM  . HYPERLIPIDEMIA  . DIARRHEA   Past Medical History  Diagnosis Date  . History of hypothyroidism   . History of hyperlipidemia   . History of hepatitis A     Hep A as a child  . History of anemia   . History of blood transfusion    Past Surgical History  Procedure Date  . Partial hysterectomy     with bleeding   History  Substance Use Topics  . Smoking status: Never Smoker   . Smokeless tobacco: Not on file  . Alcohol Use: No   Family History  Problem Relation Age of Onset  . Depression Father   . Other Mother     chondrosarcoma=met to lung (started in pelvis)  . Depression Son   . Depression Daughter   . Cancer Maternal Aunt     breast cancer   No Known Allergies Current Outpatient Prescriptions on File Prior to Visit  Medication Sig Dispense Refill   . levothyroxine (SYNTHROID) 100 MCG tablet Take 1 tablet (100 mcg total) by mouth daily.  30 tablet  11      Review of Systems Review of Systems  Constitutional: Negative for fever, appetite change, fatigue and unexpected weight change.  Eyes: Negative for pain and visual disturbance.  Respiratory: Negative for cough and shortness of breath.   Cardiovascular: Negative for cp or palpitations    Gastrointestinal: Negative for nausea, diarrhea and constipation.  Genitourinary: Negative for urgency and frequency.  Skin: Negative for pallor or rash   Neurological: Negative for weakness, light-headedness, numbness and headaches.  Hematological: Negative for adenopathy. Does not bruise/bleed easily.  Psychiatric/Behavioral: Negative for dysphoric mood. The patient is not nervous/anxious.         Objective:   Physical Exam  Constitutional: She appears well-developed and well-nourished. No distress.  HENT:  Head: Normocephalic and atraumatic.  Mouth/Throat: Oropharynx is clear and moist.  Eyes: Conjunctivae normal and EOM are normal. Pupils are equal, round, and reactive to light. Right eye exhibits no discharge. Left eye exhibits no discharge. No scleral icterus.  Neck: Normal range of motion. Neck supple. No  JVD present. Carotid bruit is not present. No thyromegaly present.  Cardiovascular: Normal rate, regular rhythm, normal heart sounds and intact distal pulses.  Exam reveals no gallop.   Pulmonary/Chest: Effort normal and breath sounds normal. No respiratory distress. She has no wheezes.  Abdominal: Soft. Bowel sounds are normal. She exhibits no distension, no abdominal bruit and no mass. There is no tenderness.  Musculoskeletal: She exhibits no edema.  Lymphadenopathy:    She has no cervical adenopathy.  Neurological: She is alert. She has normal reflexes. She displays no tremor. No cranial nerve deficit. She exhibits normal muscle tone. Coordination normal.  Skin: Skin is warm and  dry. No rash noted. No erythema. No pallor.  Psychiatric: She has a normal mood and affect.          Assessment & Plan:

## 2012-03-06 NOTE — Telephone Encounter (Signed)
Cholesterol is high Avoid red meat/ fried foods/ egg yolks/ fatty breakfast meats/ butter, cheese and high fat dairy/ and shellfish   tsh is low so we need to decrease thyroid dose Px written for call in   Schedule fasting lab and then f/u in 4-6 weeks lipids/ tsh

## 2012-03-06 NOTE — Assessment & Plan Note (Signed)
Very high in past  Now brother had MI at age 58  Stressed imp of chol control Pt is sure it is diet related Given handout -will work on that  Lab today

## 2012-03-06 NOTE — Assessment & Plan Note (Signed)
Pt chose not to go back to endocrinology Lab today for tsh Adj dose if needed  No symptoms

## 2012-03-07 NOTE — Telephone Encounter (Signed)
Rx called in as prescribed, pt notified of lab results and Dr. Royden Purl recommendation and lab and f/u appt scheduled

## 2012-04-23 ENCOUNTER — Other Ambulatory Visit: Payer: No Typology Code available for payment source

## 2012-04-30 ENCOUNTER — Ambulatory Visit: Payer: No Typology Code available for payment source | Admitting: Family Medicine

## 2012-05-28 ENCOUNTER — Ambulatory Visit: Payer: No Typology Code available for payment source | Admitting: Internal Medicine

## 2013-01-25 ENCOUNTER — Telehealth: Payer: Self-pay | Admitting: Family Medicine

## 2013-01-25 DIAGNOSIS — E039 Hypothyroidism, unspecified: Secondary | ICD-10-CM

## 2013-01-25 NOTE — Telephone Encounter (Signed)
I will do referral  

## 2013-01-25 NOTE — Telephone Encounter (Signed)
Pt notified referral done 

## 2013-01-25 NOTE — Telephone Encounter (Signed)
Pt thinks her thyroid is "out of sort" and she says you had referred her to and endocrinologist Dr. Tedd Sias (sp?), but she would prefer a different one. She would like to be referred to either Dr. Renae Fickle or Dr. Elvera Lennox. Can you put the referral in or does she need to make an apptmt w/you first? Thank you.

## 2013-02-05 ENCOUNTER — Encounter: Payer: Self-pay | Admitting: Family Medicine

## 2013-02-05 ENCOUNTER — Ambulatory Visit: Payer: Self-pay | Admitting: Family Medicine

## 2013-02-06 ENCOUNTER — Encounter: Payer: Self-pay | Admitting: *Deleted

## 2013-02-18 ENCOUNTER — Other Ambulatory Visit: Payer: Self-pay | Admitting: *Deleted

## 2013-02-18 MED ORDER — LEVOTHYROXINE SODIUM 100 MCG PO TABS: 100.0000 ug | ORAL_TABLET | Freq: Every day | ORAL | Status: DC

## 2013-05-28 ENCOUNTER — Other Ambulatory Visit: Payer: Self-pay

## 2014-07-22 ENCOUNTER — Other Ambulatory Visit: Payer: Self-pay

## 2015-06-02 ENCOUNTER — Other Ambulatory Visit: Payer: Self-pay | Admitting: Internal Medicine

## 2015-06-02 DIAGNOSIS — Z1231 Encounter for screening mammogram for malignant neoplasm of breast: Secondary | ICD-10-CM

## 2015-06-15 ENCOUNTER — Ambulatory Visit
Admission: RE | Admit: 2015-06-15 | Discharge: 2015-06-15 | Disposition: A | Discharge status: Home / Self Care | Payer: BLUE CROSS/BLUE SHIELD | Source: Ambulatory Visit | Attending: Internal Medicine | Admitting: Internal Medicine

## 2015-06-15 DIAGNOSIS — Z1231 Encounter for screening mammogram for malignant neoplasm of breast: Secondary | ICD-10-CM

## 2015-12-18 ENCOUNTER — Encounter: Payer: Self-pay | Admitting: *Deleted

## 2015-12-21 ENCOUNTER — Ambulatory Visit: Payer: BLUE CROSS/BLUE SHIELD | Admitting: Anesthesiology

## 2015-12-21 ENCOUNTER — Encounter
Admission: RE | Disposition: A | Discharge status: Home / Self Care | Payer: Self-pay | Source: Ambulatory Visit | Attending: Gastroenterology

## 2015-12-21 ENCOUNTER — Ambulatory Visit
Admission: RE | Admit: 2015-12-21 | Discharge: 2015-12-21 | Disposition: A | Discharge status: Home / Self Care | Payer: BLUE CROSS/BLUE SHIELD | Source: Ambulatory Visit | Attending: Gastroenterology | Admitting: Gastroenterology

## 2015-12-21 DIAGNOSIS — Q438 Other specified congenital malformations of intestine: Secondary | ICD-10-CM | POA: Insufficient documentation

## 2015-12-21 DIAGNOSIS — E039 Hypothyroidism, unspecified: Secondary | ICD-10-CM | POA: Insufficient documentation

## 2015-12-21 DIAGNOSIS — E785 Hyperlipidemia, unspecified: Secondary | ICD-10-CM | POA: Insufficient documentation

## 2015-12-21 DIAGNOSIS — K621 Rectal polyp: Secondary | ICD-10-CM | POA: Insufficient documentation

## 2015-12-21 DIAGNOSIS — Z1211 Encounter for screening for malignant neoplasm of colon: Secondary | ICD-10-CM | POA: Insufficient documentation

## 2015-12-21 DIAGNOSIS — K5730 Diverticulosis of large intestine without perforation or abscess without bleeding: Secondary | ICD-10-CM | POA: Insufficient documentation

## 2015-12-21 HISTORY — DX: Hypothyroidism, unspecified: E03.9

## 2015-12-21 HISTORY — PX: COLONOSCOPY WITH PROPOFOL: SHX5780

## 2015-12-21 SURGERY — COLONOSCOPY WITH PROPOFOL
Anesthesia: General

## 2015-12-21 MED ORDER — PROPOFOL 500 MG/50ML IV EMUL
INTRAVENOUS | Status: DC | PRN
Start: 2015-12-21 — End: 2015-12-21
  Administered 2015-12-21: 70 ug/kg/min via INTRAVENOUS

## 2015-12-21 MED ORDER — ONDANSETRON HCL 4 MG/2ML IJ SOLN
INTRAMUSCULAR | Status: AC
  Administered 2015-12-21: 07:00:00
  Filled 2015-12-21: qty 2

## 2015-12-21 MED ORDER — SODIUM CHLORIDE 0.9 % IV SOLN: INTRAVENOUS | Status: DC

## 2015-12-21 MED ORDER — MIDAZOLAM HCL 2 MG/2ML IJ SOLN
INTRAMUSCULAR | Status: DC | PRN
Start: 2015-12-21 — End: 2015-12-21
  Administered 2015-12-21: 1 mg via INTRAVENOUS

## 2015-12-21 MED ORDER — FENTANYL CITRATE (PF) 100 MCG/2ML IJ SOLN
INTRAMUSCULAR | Status: DC | PRN
  Administered 2015-12-21: 50 ug via INTRAVENOUS

## 2015-12-21 MED ORDER — PROPOFOL 10 MG/ML IV BOLUS
INTRAVENOUS | Status: DC | PRN
  Administered 2015-12-21: 90 mg via INTRAVENOUS

## 2015-12-21 MED ORDER — SODIUM CHLORIDE 0.9 % IV SOLN
INTRAVENOUS | Status: DC
  Administered 2015-12-21: 07:00:00 via INTRAVENOUS

## 2015-12-21 NOTE — Anesthesia Postprocedure Evaluation (Signed)
Anesthesia Post Note  Patient: Jean Pena  Procedure(s) Performed: Procedure(s) (LRB): COLONOSCOPY WITH PROPOFOL (N/A)  Patient location during evaluation: PACU Anesthesia Type: General Level of consciousness: awake and alert Pain management: pain level controlled Vital Signs Assessment: post-procedure vital signs reviewed and stable Respiratory status: spontaneous breathing, nonlabored ventilation, respiratory function stable and patient connected to nasal cannula oxygen Cardiovascular status: blood pressure returned to baseline and stable Postop Assessment: no signs of nausea or vomiting Anesthetic complications: no    Last Vitals:  Vitals:   12/21/15 0905 12/21/15 0915  BP: 113/70 119/80  Pulse: 66 63  Resp: 16 14  Temp:      Last Pain:  Vitals:   12/21/15 0845  TempSrc: Tympanic                 Yevette EdwardsJames G Klee Kolek

## 2015-12-21 NOTE — Anesthesia Preprocedure Evaluation (Signed)
Anesthesia Evaluation  Patient identified by MRN, date of birth, ID band Patient awake    Reviewed: Allergy & Precautions, H&P , NPO status , Patient's Chart, lab work & pertinent test results, reviewed documented beta blocker date and time   Airway Mallampati: II   Neck ROM: full    Dental  (+) Poor Dentition   Pulmonary neg pulmonary ROS,    Pulmonary exam normal        Cardiovascular negative cardio ROS Normal cardiovascular exam Rhythm:regular Rate:Normal     Neuro/Psych negative neurological ROS  negative psych ROS   GI/Hepatic negative GI ROS, Neg liver ROS,   Endo/Other  negative endocrine ROSHypothyroidism   Renal/GU negative Renal ROS  negative genitourinary   Musculoskeletal   Abdominal   Peds  Hematology negative hematology ROS (+)   Anesthesia Other Findings Past Medical History: No date: History of anemia No date: History of blood transfusion No date: History of hepatitis A     Comment: Hep A as a child No date: History of hyperlipidemia No date: History of hypothyroidism No date: Hypothyroidism Past Surgical History: No date: ABDOMINAL HYSTERECTOMY No date: PARTIAL HYSTERECTOMY     Comment: with bleeding   Reproductive/Obstetrics                             Anesthesia Physical Anesthesia Plan  ASA: II  Anesthesia Plan: General   Post-op Pain Management:    Induction:   Airway Management Planned:   Additional Equipment:   Intra-op Plan:   Post-operative Plan:   Informed Consent: I have reviewed the patients History and Physical, chart, labs and discussed the procedure including the risks, benefits and alternatives for the proposed anesthesia with the patient or authorized representative who has indicated his/her understanding and acceptance.   Dental Advisory Given  Plan Discussed with: CRNA  Anesthesia Plan Comments:         Anesthesia Quick  Evaluation

## 2015-12-21 NOTE — Op Note (Signed)
[facility] Gastroenterology Patient Name: Jean Pena Procedure Date: 12/21/2015 7:41 AM MRN: [id number] Account #: [id number] Date of Birth: [birth year] Admit Type: Outpatient Age: 60 Room: [facility] ENDO ROOM 1 Gender: Female Note Status: Finalized Procedure:            Colonoscopy Indications:          Screening for colorectal malignant neoplasm, This is                        the patient's first colonoscopy Providers:            Christena Deem, MD Referring MD:         Danella Penton, MD (Referring MD) Medicines:            Monitored Anesthesia Care Complications:        No immediate complications. Procedure:            Pre-Anesthesia Assessment:                       - ASA Grade Assessment: II - A patient with mild                        systemic disease.                       After obtaining informed consent, the colonoscope was                        passed under direct vision. Throughout the procedure,                        the patient's blood pressure, pulse, and oxygen                        saturations were monitored continuously. The                        Colonoscope was introduced through the anus and                        advanced to the the cecum, identified by appendiceal                        orifice and ileocecal valve. The colonoscopy was                        unusually difficult due to a tortuous colon. Successful                        completion of the procedure was aided by changing the                        patient to a supine position, changing the patient to a                        prone position and using manual pressure. The patient                        tolerated the procedure well. The quality of the bowel  preparation was good. Findings:      The sigmoid colon, descending colon and transverse colon were       significantly redundant.      One 4 mm mucosal, somewhat submucosal nodule was found in the  rectum.       Biopsies/complete removal were taken with a cold forceps for histology.      Two sessile polyps were found in the rectum. The polyps were 1 to 2 mm       in size. These polyps were removed with a cold biopsy forceps. Resection       and retrieval were complete.      A few small-mouthed diverticula were found in the sigmoid colon and       distal descending colon.      The digital rectal exam was normal. Impression:           - Redundant colon.                       - Mucosal nodule in the rectum. Biopsied.                       - Two 1 to 2 mm polyps in the rectum, removed with a                        cold biopsy forceps. Resected and retrieved.                       - Diverticulosis in the sigmoid colon and in the distal                        descending colon. Recommendation:       - Discharge patient to home.                       - Await pathology results.                       - Telephone GI clinic for pathology results in 1 week. Procedure Code(s):    --- Professional ---                       [id number], Colonoscopy, flexible; with biopsy, single or                        multiple Diagnosis Code(s):    --- Professional ---                       Z12.11, Encounter for screening for malignant neoplasm                        of colon                       K62.89, Other specified diseases of anus and rectum                       K62.1, Rectal polyp                       K57.30, Diverticulosis of large intestine without  perforation or abscess without bleeding                       Q43.8, Other specified congenital malformations of                        intestine CPT copyright 2016 American Medical Association. All rights reserved. The codes documented in this report are preliminary and upon coder review may  be revised to meet current compliance requirements. Christena Deem, MD 12/21/2015 8:45:42 AM This report has been signed  electronically. Number of Addenda: 0 Note Initiated On: 12/21/2015 7:41 AM Scope Withdrawal Time: 0 hours 13 minutes 37 seconds  Total Procedure Duration: 0 hours 49 minutes 44 seconds       [facility]

## 2015-12-21 NOTE — H&P (Signed)
Outpatient short stay form Pre-procedure 12/21/2015 7:42 AM Jean Pena Jean Pena  Primary Physician: Dr. Bethann PunchesMark Miller  Reason for visit:  Colonoscopy  History of present illness:  Patient is a 60 year old female presenting today as above. She tolerated her prep well. She takes no aspirin or blood thinning agents.    Current Facility-Administered Medications:  .  0.9 %  sodium chloride infusion, , Intravenous, Continuous, Jean Pena Jean Pena, Pena, Last Rate: 20 mL/hr at 12/21/15 0728 .  0.9 %  sodium chloride infusion, , Intravenous, Continuous, Jean Pena Jean Pena, Pena  Prescriptions Prior to Admission  Medication Sig Dispense Refill Last Dose  . diphenhydrAMINE (BENADRYL) 25 MG tablet Take 25 mg by mouth every 6 (six) hours as needed.     Marland Kitchen. levothyroxine (SYNTHROID, LEVOTHROID) 100 MCG tablet Take 1 tablet (100 mcg total) by mouth daily. **follow-up appt. required before future refills are given** 30 tablet 0 12/20/2015 at Unknown time     No Known Allergies   Past Medical History:  Diagnosis Date  . History of anemia   . History of blood transfusion   . History of hepatitis A    Hep A as a child  . History of hyperlipidemia   . History of hypothyroidism   . Hypothyroidism     Review of systems:      Physical Exam    Heart and lungs: Regular rate and rhythm without rub or gallop, lungs are bilaterally clear.    HEENT: Normocephalic atraumatic eyes are anicteric    Other:     Pertinant exam for procedure: Soft nontender nondistended bowel sounds positive normoactive.    Planned proceedures: Colonoscopy and indicated procedures. I have discussed the risks benefits and complications of procedures to include not limited to bleeding, infection, perforation and the risk of sedation and the patient wishes to proceed.    Jean Pena Jean Pena, Pena Gastroenterology 12/21/2015  7:42 AM

## 2015-12-21 NOTE — Transfer of Care (Signed)
Immediate Anesthesia Transfer of Care Note  Patient: Jean Pena  Procedure(s) Performed: Procedure(s): COLONOSCOPY WITH PROPOFOL (N/A)  Patient Location: PACU and Endoscopy Unit  Anesthesia Type:General  Level of Consciousness: patient cooperative and lethargic  Airway & Oxygen Therapy: Patient Spontanous Breathing and Patient connected to nasal cannula oxygen  Post-op Assessment: Report given to RN and Post -op Vital signs reviewed and stable  Post vital signs: Reviewed and stable  Last Vitals:  Vitals:   12/21/15 0710 12/21/15 0845  BP: 126/76 114/68  Pulse: 72 80  Resp: 16 17  Temp: (!) 35.8 C 36.1 C    Last Pain:  Vitals:   12/21/15 0845  TempSrc: Tympanic         Complications: No apparent anesthesia complications

## 2015-12-22 ENCOUNTER — Encounter: Payer: Self-pay | Admitting: Gastroenterology

## 2015-12-22 LAB — SURGICAL PATHOLOGY

## 2017-01-24 ENCOUNTER — Other Ambulatory Visit: Payer: Self-pay | Admitting: Internal Medicine

## 2017-01-24 DIAGNOSIS — Z1231 Encounter for screening mammogram for malignant neoplasm of breast: Secondary | ICD-10-CM

## 2017-02-20 ENCOUNTER — Ambulatory Visit
Admission: RE | Admit: 2017-02-20 | Discharge: 2017-02-20 | Disposition: A | Discharge status: Home / Self Care | Payer: BLUE CROSS/BLUE SHIELD | Source: Ambulatory Visit | Attending: Internal Medicine | Admitting: Internal Medicine

## 2017-02-20 DIAGNOSIS — Z1231 Encounter for screening mammogram for malignant neoplasm of breast: Secondary | ICD-10-CM | POA: Insufficient documentation

## 2018-01-29 ENCOUNTER — Other Ambulatory Visit: Payer: Self-pay | Admitting: Internal Medicine

## 2018-01-29 DIAGNOSIS — Z1231 Encounter for screening mammogram for malignant neoplasm of breast: Secondary | ICD-10-CM

## 2018-02-26 ENCOUNTER — Ambulatory Visit
Admission: RE | Admit: 2018-02-26 | Discharge: 2018-02-26 | Disposition: A | Discharge status: Home / Self Care | Payer: BLUE CROSS/BLUE SHIELD | Source: Ambulatory Visit | Attending: Internal Medicine | Admitting: Internal Medicine

## 2018-02-26 DIAGNOSIS — Z1231 Encounter for screening mammogram for malignant neoplasm of breast: Secondary | ICD-10-CM | POA: Insufficient documentation

## 2019-01-23 ENCOUNTER — Other Ambulatory Visit: Payer: Self-pay | Admitting: Internal Medicine

## 2019-01-23 DIAGNOSIS — Z1231 Encounter for screening mammogram for malignant neoplasm of breast: Secondary | ICD-10-CM

## 2019-03-04 ENCOUNTER — Ambulatory Visit
Admission: RE | Admit: 2019-03-04 | Discharge: 2019-03-04 | Disposition: A | Discharge status: Home / Self Care | Payer: Medicare HMO | Source: Ambulatory Visit | Attending: Internal Medicine | Admitting: Internal Medicine

## 2019-03-04 DIAGNOSIS — Z1231 Encounter for screening mammogram for malignant neoplasm of breast: Secondary | ICD-10-CM | POA: Insufficient documentation

## 2020-02-17 ENCOUNTER — Other Ambulatory Visit: Payer: Self-pay | Admitting: Internal Medicine

## 2020-02-17 DIAGNOSIS — Z1231 Encounter for screening mammogram for malignant neoplasm of breast: Secondary | ICD-10-CM

## 2020-03-25 ENCOUNTER — Ambulatory Visit
Admission: RE | Admit: 2020-03-25 | Discharge: 2020-03-25 | Disposition: A | Discharge status: Home / Self Care | Payer: Medicare HMO | Source: Ambulatory Visit | Attending: Internal Medicine | Admitting: Internal Medicine

## 2020-03-25 ENCOUNTER — Other Ambulatory Visit: Payer: Self-pay

## 2020-03-25 DIAGNOSIS — Z1231 Encounter for screening mammogram for malignant neoplasm of breast: Secondary | ICD-10-CM | POA: Insufficient documentation

## 2020-04-22 ENCOUNTER — Other Ambulatory Visit: Payer: Self-pay

## 2020-04-22 ENCOUNTER — Inpatient Hospital Stay: Payer: Medicare HMO

## 2020-04-22 ENCOUNTER — Encounter: Payer: Self-pay | Admitting: Oncology

## 2020-04-22 ENCOUNTER — Inpatient Hospital Stay: Payer: Medicare HMO | Attending: Oncology | Admitting: Oncology

## 2020-04-22 VITALS — BP 131/73 | HR 77 | Temp 97.2°F | Ht 63.0 in | Wt 121.2 lb

## 2020-04-22 DIAGNOSIS — D509 Iron deficiency anemia, unspecified: Secondary | ICD-10-CM

## 2020-04-22 DIAGNOSIS — N200 Calculus of kidney: Secondary | ICD-10-CM | POA: Insufficient documentation

## 2020-04-22 DIAGNOSIS — E039 Hypothyroidism, unspecified: Secondary | ICD-10-CM | POA: Diagnosis not present

## 2020-04-22 HISTORY — DX: Iron deficiency anemia, unspecified: D50.9

## 2020-04-22 LAB — CBC WITH DIFFERENTIAL/PLATELET
Abs Immature Granulocytes: 0.02 10*3/uL (ref 0.00–0.07)
Basophils Absolute: 0 10*3/uL (ref 0.0–0.1)
Basophils Relative: 0 %
Eosinophils Absolute: 0.1 10*3/uL (ref 0.0–0.5)
Eosinophils Relative: 2 %
HCT: 33.4 % — ABNORMAL LOW (ref 36.0–46.0)
Hemoglobin: 10.8 g/dL — ABNORMAL LOW (ref 12.0–15.0)
Immature Granulocytes: 0 %
Lymphocytes Relative: 26 %
Lymphs Abs: 1.8 10*3/uL (ref 0.7–4.0)
MCH: 26.2 pg (ref 26.0–34.0)
MCHC: 32.3 g/dL (ref 30.0–36.0)
MCV: 81.1 fL (ref 80.0–100.0)
Monocytes Absolute: 0.7 10*3/uL (ref 0.1–1.0)
Monocytes Relative: 10 %
Neutro Abs: 4.2 10*3/uL (ref 1.7–7.7)
Neutrophils Relative %: 62 %
Platelets: 365 10*3/uL (ref 150–400)
RBC: 4.12 MIL/uL (ref 3.87–5.11)
RDW: 13.9 % (ref 11.5–15.5)
WBC: 6.8 10*3/uL (ref 4.0–10.5)
nRBC: 0 % (ref 0.0–0.2)

## 2020-04-22 LAB — COMPREHENSIVE METABOLIC PANEL
ALT: 22 U/L (ref 0–44)
AST: 28 U/L (ref 15–41)
Albumin: 4.1 g/dL (ref 3.5–5.0)
Alkaline Phosphatase: 81 U/L (ref 38–126)
Anion gap: 10 (ref 5–15)
BUN: 23 mg/dL (ref 8–23)
CO2: 26 mmol/L (ref 22–32)
Calcium: 9.2 mg/dL (ref 8.9–10.3)
Chloride: 101 mmol/L (ref 98–111)
Creatinine, Ser: 0.85 mg/dL (ref 0.44–1.00)
GFR, Estimated: >60 mL/min (ref >60)
Glucose, Bld: 82 mg/dL (ref 70–99)
Potassium: 3.8 mmol/L (ref 3.5–5.1)
Sodium: 137 mmol/L (ref 135–145)
Total Bilirubin: 0.7 mg/dL (ref 0.3–1.2)
Total Protein: 7.3 g/dL (ref 6.5–8.1)

## 2020-04-22 LAB — IRON AND TIBC
Iron: 35 ug/dL (ref 28–170)
Saturation Ratios: 6 % — ABNORMAL LOW (ref 10.4–31.8)
TIBC: 547 ug/dL — ABNORMAL HIGH (ref 250–450)
UIBC: 512 ug/dL

## 2020-04-22 LAB — FERRITIN: Ferritin: 6 ng/mL — ABNORMAL LOW (ref 11–307)

## 2020-04-22 NOTE — Addendum Note (Signed)
Addended by: Rickard Patience on: 04/22/2020 04:50 PM   Modules accepted: Orders

## 2020-04-22 NOTE — Progress Notes (Signed)
Pt here to establish care for anemia. Pt currently in pain due to kidney stone

## 2020-04-22 NOTE — Progress Notes (Signed)
Hematology/Oncology Consult note [facility] Telephone:([phone] [phone]   Patient Care Team: Rusty Aus, MD as PCP - General (Internal Medicine)  REFERRING PROVIDER: Rusty Aus, MD CHIEF COMPLAINTS/REASON FOR VISIT:  Evaluation of iron deficiency anemia  HISTORY OF PRESENTING ILLNESS:  Jean Pena is a  65 y.o.  female with PMH listed below was seen in consultation at the request of Rusty Aus, MD   for evaluation of iron deficiency anemia.   Reviewed patient's recent labs  04/07/2020 labs revealed anemia with hemoglobin of 10.3, ferritin 7.  Vitamin B12 558, TSH less than 0.001 Anemia is new onset since November 2021. Patient has a history of recurrent nephrolithiasis and currently had a flare episode.  She is in mild stress with flank pain/discomfort. Patient had a UA done at the primary care provider's office which showed positive nitrate, leukocyte esterase, increased white blood cell count.  Negative for red blood cell count.  Patient was recommended by PCP to take Flomax 0.4 mg daily  Associated signs and symptoms: Patient denies any shortness of breath or fatigue. Denies weight loss, easy bruising, hematochezia, hemoptysis, hematuria. Context:  History of iron deficiency: Denies Rectal bleeding: Denies.  Last colonoscopy was in 2017 by Dr. Gustavo Lah.  Colonoscopy showed small rectal polyps resected and retrieved.  Small mucosal nodule.  Pathology showed hyperplastic polyp.  Negative for dysplasia and malignancy.  Diverticulosis in the sigmoid colon and distal descending colon. Menstrual bleeding/ Vaginal bleeding : History of hysterectomy. Hematemesis or hemoptysis : denies Blood in urine : denies      Review of Systems  Constitutional: Negative for appetite change, chills, fatigue and fever.  HENT:   Negative for hearing loss and voice change.   Eyes: Negative for eye problems.  Respiratory: Negative for chest tightness  and cough.   Cardiovascular: Negative for chest pain.  Gastrointestinal: Negative for abdominal distention, abdominal pain and blood in stool.  Endocrine: Negative for hot flashes.  Genitourinary: Negative for difficulty urinating and frequency.        Nephrolithiasis, flank pain  Musculoskeletal: Negative for arthralgias.  Skin: Negative for itching and rash.  Neurological: Negative for extremity weakness.  Hematological: Negative for adenopathy.  Psychiatric/Behavioral: Negative for confusion.    MEDICAL HISTORY:  Past Medical History:  Diagnosis Date  . History of anemia   . History of blood transfusion   . History of hepatitis A    Hep A as a child  . History of hyperlipidemia   . History of hypothyroidism   . Hypothyroidism     SURGICAL HISTORY: Past Surgical History:  Procedure Laterality Date  . ABDOMINAL HYSTERECTOMY    . COLONOSCOPY WITH PROPOFOL N/A 12/21/2015   Procedure: COLONOSCOPY WITH PROPOFOL;  Surgeon: Lollie Sails, MD;  Location: [facility] ENDOSCOPY;  Service: Endoscopy;  Laterality: N/A;  . PARTIAL HYSTERECTOMY     with bleeding    SOCIAL HISTORY: Social History   Socioeconomic History  . Marital status: Married    Spouse name: Not on file  . Number of children: Not on file  . Years of education: Not on file  . Highest education level: Not on file  Occupational History  . Not on file  Tobacco Use  . Smoking status: Never Smoker  . Smokeless tobacco: Never Used  Vaping Use  . Vaping Use: Never used  Substance and Sexual Activity  . Alcohol use: No  . Drug use: No  . Sexual activity: Not on file  Other Topics Concern  . Not on file  Social History Narrative  . Not on file   Social Determinants of Health   Financial Resource Strain: Not on file  Food Insecurity: Not on file  Transportation Needs: Not on file  Physical Activity: Not on file  Stress: Not on file  Social Connections: Not on file  Intimate Partner Violence: Not on file     FAMILY HISTORY: Family History  Problem Relation Age of Onset  . Other Mother        chondrosarcoma=met to lung (started in pelvis)  . Depression Father   . Parkinson's disease Father   . Depression Son   . Depression Daughter   . Cancer Maternal Aunt        breast cancer  . Breast cancer Maternal Aunt     ALLERGIES:  has No Known Allergies.  MEDICATIONS:  Current Outpatient Medications  Medication Sig Dispense Refill  . diphenhydrAMINE (BENADRYL) 25 MG tablet Take 25 mg by mouth every 6 (six) hours as needed.    Marland Kitchen levothyroxine (SYNTHROID, LEVOTHROID) 100 MCG tablet Take 1 tablet (100 mcg total) by mouth daily. **follow-up appt. required before future refills are given** (Patient not taking: Reported on 04/22/2020) 30 tablet 0   No current facility-administered medications for this visit.     PHYSICAL EXAMINATION: ECOG PERFORMANCE STATUS: 0 - Asymptomatic Vitals:   04/22/20 1049  BP: 131/73  Pulse: 77  Temp: (!) 97.2 F (36.2 C)   Filed Weights   04/22/20 1049  Weight: 121 lb 3.2 oz (55 kg)    Physical Exam Constitutional:      General: She is in acute distress.  HENT:     Head: Normocephalic and atraumatic.  Eyes:     General: No scleral icterus. Cardiovascular:     Rate and Rhythm: Normal rate and regular rhythm.     Heart sounds: Normal heart sounds.  Pulmonary:     Effort: Pulmonary effort is normal. No respiratory distress.     Breath sounds: No wheezing.  Abdominal:     General: Bowel sounds are normal. There is no distension.     Palpations: Abdomen is soft.  Musculoskeletal:        General: No deformity. Normal range of motion.     Cervical back: Normal range of motion and neck supple.  Skin:    General: Skin is warm and dry.     Findings: No erythema or rash.  Neurological:     Mental Status: She is alert and oriented to person, place, and time. Mental status is at baseline.     Cranial Nerves: No cranial nerve deficit.     Coordination:  Coordination normal.  Psychiatric:        Mood and Affect: Mood normal.       CMP Latest Ref Rng & Units 04/22/2020  Glucose 70 - 99 mg/dL 82  BUN 8 - 23 mg/dL 23  Creatinine 0.44 - 1.00 mg/dL 0.85  Sodium 135 - 145 mmol/L 137  Potassium 3.5 - 5.1 mmol/L 3.8  Chloride 98 - 111 mmol/L 101  CO2 22 - 32 mmol/L 26  Calcium 8.9 - 10.3 mg/dL 9.2  Total Protein 6.5 - 8.1 g/dL 7.3  Total Bilirubin 0.3 - 1.2 mg/dL 0.7  Alkaline Phos 38 - 126 U/L 81  AST 15 - 41 U/L 28  ALT 0 - 44 U/L 22   CBC Latest Ref Rng & Units 04/22/2020  WBC 4.0 - 10.5 K/uL 6.8  Hemoglobin 12.0 - 15.0 g/dL 10.8(L)  Hematocrit 36.0 - 46.0 % 33.4(L)  Platelets 150 - 400 K/uL 365     LABORATORY DATA:  I have reviewed the data as listed Lab Results  Component Value Date   WBC 6.8 04/22/2020   HGB 10.8 (L) 04/22/2020   HCT 33.4 (L) 04/22/2020   MCV 81.1 04/22/2020   PLT 365 04/22/2020   Recent Labs    04/22/20 1131  NA 137  K 3.8  CL 101  CO2 26  GLUCOSE 82  BUN 23  CREATININE 0.85  CALCIUM 9.2  GFRNONAA >60  PROT 7.3  ALBUMIN 4.1  AST 28  ALT 22  ALKPHOS 81  BILITOT 0.7   Iron/TIBC/Ferritin/ %Sat    Component Value Date/Time   IRON 35 04/22/2020 1131   TIBC 547 (H) 04/22/2020 1131   FERRITIN 6 (L) 04/22/2020 1131   IRONPCTSAT 6 (L) 04/22/2020 1131     RADIOGRAPHIC STUDIES: I have personally reviewed the radiological images as listed and agreed with the findings in the report. MM 3D SCREEN BREAST BILATERAL  Result Date: 03/25/2020 CLINICAL DATA:  Screening. EXAM: DIGITAL SCREENING BILATERAL MAMMOGRAM WITH TOMO AND CAD COMPARISON:  Previous exam(s). ACR Breast Density Category c: The breast tissue is heterogeneously dense, which may obscure small masses. FINDINGS: There are no findings suspicious for malignancy. Images were processed with CAD. IMPRESSION: No mammographic evidence of malignancy. A result letter of this screening mammogram will be mailed directly to the patient.  RECOMMENDATION: Screening mammogram in one year. (Code:SM-B-01Y) BI-RADS CATEGORY  1: Negative. Electronically Signed   By: Valentino Saxon MD   On: 03/25/2020 16:53       ASSESSMENT & PLAN:  1. Iron deficiency anemia, unspecified iron deficiency anemia type   2. Nephrolithiasis    I will check CBC, iron TIBC ferritin.  CMP CBC showed anemia with hemoglobin of 10.8, severe iron deficiency with ferritin of 6. Patient reports that she cannot tolerate oral iron supplementation. Plan IV iron with Venofer 268m weekly x 4 doses. Allergy reactions/infusion reaction including anaphylactic reaction discussed with patient. Other side effects include but not limited to high blood pressure, skin rash, weight gain, leg swelling, etc. Patient voices understanding and willing to proceed.  Nephrolithiasis, managed by PCP.  Continue Flomax.  Encourage oral hydration.  Etiology of iron deficiency, suspect GI blood loss.  Urine is negative for RBC.  History of hysterectomy. Discussed with patient about follow-up with gastroenterology.    Orders Placed This Encounter  Procedures  . Celiac panel 10    Standing Status:   Future    Number of Occurrences:   1    Standing Expiration Date:   04/22/2021  . CBC with Differential/Platelet    Standing Status:   Future    Number of Occurrences:   1    Standing Expiration Date:   04/22/2021  . Ferritin    Standing Status:   Future    Number of Occurrences:   1    Standing Expiration Date:   04/22/2021  . Iron and TIBC    Standing Status:   Future    Number of Occurrences:   1    Standing Expiration Date:   04/22/2021  . Comprehensive metabolic panel    Standing Status:   Future    Number of Occurrences:   1    Standing Expiration Date:   04/22/2021    All questions were answered. The patient knows to call the clinic with  any problems questions or concerns.  Cc Rusty Aus, MD  Return of visit: 8 weeks Thank you for this kind referral and the  opportunity to participate in the care of this patient. A copy of today's note is routed to referring provider   Earlie Server, MD, PhD Hematology Oncology [place] [facility] 04/22/2020

## 2020-04-23 ENCOUNTER — Telehealth: Payer: Self-pay

## 2020-04-23 NOTE — Telephone Encounter (Signed)
Patient informed of MD recommendation. Message sent to scheduling pool to contact pt with appts.

## 2020-04-23 NOTE — Telephone Encounter (Signed)
-----   Message from Rickard Patience, MD sent at 04/22/2020  4:49 PM EST ----- Please arrange patient to proceed with IV Venofer weekly x4.  I discussed about iron infusion during this encounter. Please arrange her to follow-up with me in 8 weeks.  Labs prior, iron TIBC ferritin, CBC, virtual visit +/- Venofer.  Thank you labs are ordered.

## 2020-04-24 ENCOUNTER — Telehealth: Payer: Self-pay | Admitting: Oncology

## 2020-04-24 LAB — CELIAC PANEL 10
Antigliadin Abs, IgA: 4 units (ref 0–19)
Endomysial Ab, IgA: NEGATIVE
Gliadin IgG: 1 units (ref 0–19)
IgA: 196 mg/dL (ref 87–352)
Tissue Transglut Ab: <2 U/mL (ref 0–5)
Tissue Transglutaminase Ab, IgA: <2 U/mL (ref 0–3)

## 2020-04-24 NOTE — Telephone Encounter (Signed)
Jean Pena, Please schedule patient for Venofer *new* weekly x4.     8 weeks: labs- day 1/ Mychart day 2 (afternoon) / poss venofer day 3. Please call pt with appts, she is aware and will wait for call with appts.

## 2020-04-24 NOTE — Telephone Encounter (Signed)
Called pt to discuss all future appts. Confirmed. Sending AVS via mail.

## 2020-04-27 ENCOUNTER — Telehealth: Payer: Self-pay | Admitting: Oncology

## 2020-04-27 NOTE — Telephone Encounter (Signed)
Pt left vm requesting a call back to reschedule her 2/8 infusion appt.  Routing to scheduler for follow up.

## 2020-04-28 ENCOUNTER — Inpatient Hospital Stay: Payer: Medicare HMO

## 2020-05-04 ENCOUNTER — Inpatient Hospital Stay: Payer: Medicare HMO

## 2020-05-04 VITALS — BP 110/70 | HR 70 | Temp 97.4°F | Resp 18

## 2020-05-04 DIAGNOSIS — D509 Iron deficiency anemia, unspecified: Secondary | ICD-10-CM

## 2020-05-04 MED ORDER — SODIUM CHLORIDE 0.9 % IV SOLN
Freq: Once | INTRAVENOUS | Status: AC
  Filled 2020-05-04: qty 250

## 2020-05-04 MED ORDER — IRON SUCROSE 20 MG/ML IV SOLN
200.0000 mg | Freq: Once | INTRAVENOUS | Status: AC
  Administered 2020-05-04: 200 mg via INTRAVENOUS
  Filled 2020-05-04: qty 10

## 2020-05-04 MED ORDER — SODIUM CHLORIDE 0.9 % IV SOLN: 200.0000 mg | Freq: Once | INTRAVENOUS | Status: DC

## 2020-05-04 NOTE — Progress Notes (Signed)
1442- Patient tolerated Venofer infusion well. Patient and vital signs stable. Patient discharged to home at this time.

## 2020-05-06 ENCOUNTER — Inpatient Hospital Stay: Payer: Medicare HMO

## 2020-05-11 ENCOUNTER — Inpatient Hospital Stay: Payer: Medicare HMO

## 2020-05-11 VITALS — BP 117/79 | HR 80 | Temp 98.6°F | Resp 20

## 2020-05-11 DIAGNOSIS — D509 Iron deficiency anemia, unspecified: Secondary | ICD-10-CM | POA: Diagnosis not present

## 2020-05-11 MED ORDER — IRON SUCROSE 20 MG/ML IV SOLN
200.0000 mg | Freq: Once | INTRAVENOUS | Status: AC
  Administered 2020-05-11: 200 mg via INTRAVENOUS
  Filled 2020-05-11: qty 10

## 2020-05-11 MED ORDER — SODIUM CHLORIDE 0.9 % IV SOLN: 200.0000 mg | Freq: Once | INTRAVENOUS | Status: DC

## 2020-05-11 MED ORDER — SODIUM CHLORIDE 0.9 % IV SOLN
Freq: Once | INTRAVENOUS | Status: AC
  Filled 2020-05-11: qty 250

## 2020-05-11 NOTE — Progress Notes (Signed)
Pt tolerated her venofer infusion well with no problems or complaints.  Pt left infusion suite stable and ambulatory.

## 2020-05-12 ENCOUNTER — Inpatient Hospital Stay: Payer: Medicare HMO

## 2020-05-19 ENCOUNTER — Inpatient Hospital Stay: Payer: Medicare HMO

## 2020-05-22 ENCOUNTER — Inpatient Hospital Stay: Payer: Medicare HMO | Attending: Oncology

## 2020-05-22 VITALS — BP 121/71 | HR 67 | Temp 98.9°F | Resp 16

## 2020-05-22 DIAGNOSIS — D509 Iron deficiency anemia, unspecified: Secondary | ICD-10-CM | POA: Insufficient documentation

## 2020-05-22 MED ORDER — IRON SUCROSE 20 MG/ML IV SOLN
200.0000 mg | Freq: Once | INTRAVENOUS | Status: AC
  Administered 2020-05-22: 200 mg via INTRAVENOUS
  Filled 2020-05-22: qty 10

## 2020-05-22 MED ORDER — SODIUM CHLORIDE 0.9 % IV SOLN
Freq: Once | INTRAVENOUS | Status: AC
  Filled 2020-05-22: qty 250

## 2020-05-22 MED ORDER — SODIUM CHLORIDE 0.9 % IV SOLN: 200.0000 mg | Freq: Once | INTRAVENOUS | Status: DC

## 2020-05-27 ENCOUNTER — Other Ambulatory Visit: Payer: Self-pay

## 2020-05-27 ENCOUNTER — Inpatient Hospital Stay: Payer: Medicare HMO | Attending: Oncology

## 2020-05-27 VITALS — BP 122/70 | HR 67 | Temp 96.2°F | Resp 18

## 2020-05-27 DIAGNOSIS — D509 Iron deficiency anemia, unspecified: Secondary | ICD-10-CM | POA: Diagnosis not present

## 2020-05-27 MED ORDER — SODIUM CHLORIDE 0.9 % IV SOLN: 200.0000 mg | Freq: Once | INTRAVENOUS | Status: DC

## 2020-05-27 MED ORDER — SODIUM CHLORIDE 0.9 % IV SOLN
Freq: Once | INTRAVENOUS | Status: AC
  Filled 2020-05-27: qty 250

## 2020-05-27 MED ORDER — IRON SUCROSE 20 MG/ML IV SOLN
200.0000 mg | Freq: Once | INTRAVENOUS | Status: AC
  Administered 2020-05-27: 200 mg via INTRAVENOUS
  Filled 2020-05-27: qty 10

## 2020-06-05 ENCOUNTER — Other Ambulatory Visit: Payer: Self-pay

## 2020-06-05 ENCOUNTER — Inpatient Hospital Stay: Payer: Medicare HMO | Attending: Oncology

## 2020-06-05 DIAGNOSIS — E039 Hypothyroidism, unspecified: Secondary | ICD-10-CM | POA: Diagnosis not present

## 2020-06-05 DIAGNOSIS — D509 Iron deficiency anemia, unspecified: Secondary | ICD-10-CM | POA: Diagnosis not present

## 2020-06-05 LAB — CBC WITH DIFFERENTIAL/PLATELET
Abs Immature Granulocytes: 0.02 10*3/uL (ref 0.00–0.07)
Basophils Absolute: 0 10*3/uL (ref 0.0–0.1)
Basophils Relative: 1 %
Eosinophils Absolute: 0.1 10*3/uL (ref 0.0–0.5)
Eosinophils Relative: 2 %
HCT: 36.7 % (ref 36.0–46.0)
Hemoglobin: 11.7 g/dL — ABNORMAL LOW (ref 12.0–15.0)
Immature Granulocytes: 0 %
Lymphocytes Relative: 31 %
Lymphs Abs: 1.5 10*3/uL (ref 0.7–4.0)
MCH: 27.1 pg (ref 26.0–34.0)
MCHC: 31.9 g/dL (ref 30.0–36.0)
MCV: 85 fL (ref 80.0–100.0)
Monocytes Absolute: 0.4 10*3/uL (ref 0.1–1.0)
Monocytes Relative: 8 %
Neutro Abs: 2.7 10*3/uL (ref 1.7–7.7)
Neutrophils Relative %: 58 %
Platelets: 300 10*3/uL (ref 150–400)
RBC: 4.32 MIL/uL (ref 3.87–5.11)
RDW: 17.7 % — ABNORMAL HIGH (ref 11.5–15.5)
WBC: 4.8 10*3/uL (ref 4.0–10.5)
nRBC: 0 % (ref 0.0–0.2)

## 2020-06-05 LAB — IRON AND TIBC
Iron: 73 ug/dL (ref 28–170)
Saturation Ratios: 18 % (ref 10.4–31.8)
TIBC: 396 ug/dL (ref 250–450)
UIBC: 323 ug/dL

## 2020-06-05 LAB — FERRITIN: Ferritin: 156 ng/mL (ref 11–307)

## 2020-06-08 ENCOUNTER — Other Ambulatory Visit: Payer: Self-pay

## 2020-06-08 ENCOUNTER — Encounter: Payer: Self-pay | Admitting: Oncology

## 2020-06-08 ENCOUNTER — Inpatient Hospital Stay (HOSPITAL_BASED_OUTPATIENT_CLINIC_OR_DEPARTMENT_OTHER): Payer: Medicare HMO | Admitting: Oncology

## 2020-06-08 ENCOUNTER — Inpatient Hospital Stay: Payer: Medicare HMO

## 2020-06-08 VITALS — BP 106/65 | HR 83 | Temp 96.5°F | Resp 20 | Wt 116.8 lb

## 2020-06-08 DIAGNOSIS — D509 Iron deficiency anemia, unspecified: Secondary | ICD-10-CM

## 2020-06-08 NOTE — Progress Notes (Signed)
Hematology/Oncology Consult note [facility] [phone] [phone]   Patient Care Team: Rusty Aus, MD as PCP - General (Internal Medicine)  REFERRING PROVIDER: Rusty Aus, MD CHIEF COMPLAINTS/REASON FOR VISIT:  Evaluation of iron deficiency anemia  HISTORY OF PRESENTING ILLNESS:  Jean Pena is a  65 y.o.  female with PMH listed below was seen in consultation at the request of Rusty Aus, MD   for evaluation of iron deficiency anemia.   Reviewed patient's recent labs  04/07/2020 labs revealed anemia with hemoglobin of 10.3, ferritin 7.  Vitamin B12 558, TSH less than 0.001 Anemia is new onset since November 2021. Patient has a history of recurrent nephrolithiasis and currently had a flare episode.  She is in mild stress with flank pain/discomfort. Patient had a UA done at the primary care provider's office which showed positive nitrate, leukocyte esterase, increased white blood cell count.  Negative for red blood cell count.  Patient was recommended by PCP to take Flomax 0.4 mg daily  Associated signs and symptoms: Patient denies any shortness of breath or fatigue. Denies weight loss, easy bruising, hematochezia, hemoptysis, hematuria. Context:  History of iron deficiency: Denies Rectal bleeding: Denies.  Last colonoscopy was in 2017 by Dr. Gustavo Lah.  Colonoscopy showed small rectal polyps resected and retrieved.  Small mucosal nodule.  Pathology showed hyperplastic polyp.  Negative for dysplasia and malignancy.  Diverticulosis in the sigmoid colon and distal descending colon. Menstrual bleeding/ Vaginal bleeding : History of hysterectomy. Hematemesis or hemoptysis : denies Blood in urine : denies   INTERVAL HISTORY Jean Pena is a 65 y.o. female who has above history reviewed by me today presents for follow up visit for iron deficiency anemia Problems and complaints are listed below: Patient is status post IV Venofer  treatments.  She reports feeling better.  She also mentioned that she was feeling weak and fatigued due to change of her thyroid medication which is now adjusted back to her baseline.  She is feeling much better.  No new complaints.  Denies any black or bloody stool. No flank pain Patient informs me that she has donated blood once at the end of December 2021 and another time earlier this year.   Review of Systems  Constitutional: Negative for appetite change, chills, fatigue and fever.  HENT:   Negative for hearing loss and voice change.   Eyes: Negative for eye problems.  Respiratory: Negative for chest tightness and cough.   Cardiovascular: Negative for chest pain.  Gastrointestinal: Negative for abdominal distention, abdominal pain and blood in stool.  Endocrine: Negative for hot flashes.  Genitourinary: Negative for difficulty urinating and frequency.   Musculoskeletal: Negative for arthralgias.  Skin: Negative for itching and rash.  Neurological: Negative for extremity weakness.  Hematological: Negative for adenopathy.  Psychiatric/Behavioral: Negative for confusion.    MEDICAL HISTORY:  Past Medical History:  Diagnosis Date  . History of anemia   . History of blood transfusion   . History of hepatitis A    Hep A as a child  . History of hyperlipidemia   . History of hypothyroidism   . Hypothyroidism   . Iron deficiency anemia 04/22/2020    SURGICAL HISTORY: Past Surgical History:  Procedure Laterality Date  . ABDOMINAL HYSTERECTOMY    . COLONOSCOPY WITH PROPOFOL N/A 12/21/2015   Procedure: COLONOSCOPY WITH PROPOFOL;  Surgeon: Lollie Sails, MD;  Location: [facility] ENDOSCOPY;  Service: Endoscopy;  Laterality: N/A;  . PARTIAL HYSTERECTOMY  with bleeding    SOCIAL HISTORY: Social History   Socioeconomic History  . Marital status: Married    Spouse name: Not on file  . Number of children: Not on file  . Years of education: Not on file  . Highest education level:  Not on file  Occupational History  . Not on file  Tobacco Use  . Smoking status: Never Smoker  . Smokeless tobacco: Never Used  Vaping Use  . Vaping Use: Never used  Substance and Sexual Activity  . Alcohol use: No  . Drug use: No  . Sexual activity: Not on file  Other Topics Concern  . Not on file  Social History Narrative  . Not on file   Social Determinants of Health   Financial Resource Strain: Not on file  Food Insecurity: Not on file  Transportation Needs: Not on file  Physical Activity: Not on file  Stress: Not on file  Social Connections: Not on file  Intimate Partner Violence: Not on file    FAMILY HISTORY: Family History  Problem Relation Age of Onset  . Other Mother        chondrosarcoma=met to lung (started in pelvis)  . Depression Father   . Parkinson's disease Father   . Depression Son   . Depression Daughter   . Cancer Maternal Aunt        breast cancer  . Breast cancer Maternal Aunt     ALLERGIES:  has No Known Allergies.  MEDICATIONS:  Current Outpatient Medications  Medication Sig Dispense Refill  . diphenhydrAMINE (BENADRYL) 25 MG tablet Take 25 mg by mouth every 6 (six) hours as needed.    . EUTHYROX 88 MCG tablet Take 88 mcg by mouth every morning.     No current facility-administered medications for this visit.     PHYSICAL EXAMINATION: ECOG PERFORMANCE STATUS: 0 - Asymptomatic Vitals:   06/08/20 1305  BP: 106/65  Pulse: 83  Resp: 20  Temp: (!) 96.5 F (35.8 C)   Filed Weights   06/08/20 1305  Weight: 116 lb 12.8 oz (53 kg)    Physical Exam Constitutional:      General: She is not in acute distress. HENT:     Head: Normocephalic and atraumatic.  Eyes:     General: No scleral icterus. Cardiovascular:     Rate and Rhythm: Normal rate and regular rhythm.     Heart sounds: Normal heart sounds.  Pulmonary:     Effort: Pulmonary effort is normal. No respiratory distress.     Breath sounds: No wheezing.  Abdominal:      General: Bowel sounds are normal. There is no distension.     Palpations: Abdomen is soft.  Musculoskeletal:        General: No deformity. Normal range of motion.     Cervical back: Normal range of motion and neck supple.  Skin:    General: Skin is warm and dry.     Findings: No erythema or rash.  Neurological:     Mental Status: She is alert and oriented to person, place, and time. Mental status is at baseline.     Cranial Nerves: No cranial nerve deficit.     Coordination: Coordination normal.  Psychiatric:        Mood and Affect: Mood normal.       CMP Latest Ref Rng & Units 04/22/2020  Glucose 70 - 99 mg/dL 82  BUN 8 - 23 mg/dL 23  Creatinine 0.44 - 1.00 mg/dL 0.85  Sodium 135 - 145 mmol/L 137  Potassium 3.5 - 5.1 mmol/L 3.8  Chloride 98 - 111 mmol/L 101  CO2 22 - 32 mmol/L 26  Calcium 8.9 - 10.3 mg/dL 9.2  Total Protein 6.5 - 8.1 g/dL 7.3  Total Bilirubin 0.3 - 1.2 mg/dL 0.7  Alkaline Phos 38 - 126 U/L 81  AST 15 - 41 U/L 28  ALT 0 - 44 U/L 22   CBC Latest Ref Rng & Units 06/05/2020  WBC 4.0 - 10.5 K/uL 4.8  Hemoglobin 12.0 - 15.0 g/dL 11.7(L)  Hematocrit 36.0 - 46.0 % 36.7  Platelets 150 - 400 K/uL 300     LABORATORY DATA:  I have reviewed the data as listed Lab Results  Component Value Date   WBC 4.8 06/05/2020   HGB 11.7 (L) 06/05/2020   HCT 36.7 06/05/2020   MCV 85.0 06/05/2020   PLT 300 06/05/2020   Recent Labs    04/22/20 1131  NA 137  K 3.8  CL 101  CO2 26  GLUCOSE 82  BUN 23  CREATININE 0.85  CALCIUM 9.2  GFRNONAA >60  PROT 7.3  ALBUMIN 4.1  AST 28  ALT 22  ALKPHOS 81  BILITOT 0.7   Iron/TIBC/Ferritin/ %Sat    Component Value Date/Time   IRON 73 06/05/2020 1042   TIBC 396 06/05/2020 1042   FERRITIN 156 06/05/2020 1042   IRONPCTSAT 18 06/05/2020 1042     RADIOGRAPHIC STUDIES: I have personally reviewed the radiological images as listed and agreed with the findings in the report. No results found.     ASSESSMENT & PLAN:   1. Iron deficiency anemia, unspecified iron deficiency anemia type     Labs reviewed and discussed with patient.  Status post IV Venofer treatments. Hemoglobin has improved to 11.7.  Iron panel has improved as well. Etiology of the iron deficiency, unknown.  She has had hysterectomy, urine is negative for blood.  Suspect GI blood loss or due to her recent blood donations.  Recommend patient not to donate blood anymore at this point. Colonoscopy was in 2017.  Follow-up with gastroenterology. I recommend patient to have CBC and iron panel monitored in 6 months.   Patient prefers to go back to her primary care provider for further monitoring. Patient is discharged from clinic today. All questions were answered. The patient knows to call the clinic with any problems questions or concerns.  Cc Rusty Aus, MD   Earlie Server, MD, PhD Hematology Oncology [place] at [facility] 06/08/2020

## 2020-06-08 NOTE — Progress Notes (Signed)
Patient denies new problems/concerns today.   °

## 2020-06-16 ENCOUNTER — Other Ambulatory Visit: Payer: Medicare HMO

## 2020-06-17 ENCOUNTER — Ambulatory Visit: Payer: Medicare HMO | Admitting: Oncology

## 2020-06-17 ENCOUNTER — Other Ambulatory Visit: Payer: Medicare HMO

## 2020-06-18 ENCOUNTER — Ambulatory Visit: Payer: Medicare HMO

## 2020-06-19 ENCOUNTER — Other Ambulatory Visit: Payer: Medicare HMO

## 2021-02-09 ENCOUNTER — Other Ambulatory Visit: Payer: Self-pay | Admitting: Internal Medicine

## 2021-02-09 DIAGNOSIS — Z1231 Encounter for screening mammogram for malignant neoplasm of breast: Secondary | ICD-10-CM

## 2021-03-22 ENCOUNTER — Encounter: Payer: Self-pay | Admitting: Oncology

## 2021-03-24 ENCOUNTER — Encounter: Payer: Self-pay | Admitting: Oncology

## 2021-03-29 ENCOUNTER — Ambulatory Visit
Admission: RE | Admit: 2021-03-29 | Discharge: 2021-03-29 | Disposition: A | Discharge status: Home / Self Care | Payer: No Typology Code available for payment source | Source: Ambulatory Visit | Attending: Internal Medicine | Admitting: Internal Medicine

## 2021-03-29 ENCOUNTER — Other Ambulatory Visit: Payer: Self-pay

## 2021-03-29 DIAGNOSIS — Z1231 Encounter for screening mammogram for malignant neoplasm of breast: Secondary | ICD-10-CM | POA: Diagnosis not present

## 2021-06-15 ENCOUNTER — Other Ambulatory Visit: Payer: Self-pay

## 2021-06-15 ENCOUNTER — Other Ambulatory Visit: Payer: Self-pay | Admitting: Internal Medicine

## 2021-06-15 ENCOUNTER — Emergency Department
Admission: EM | Admit: 2021-06-15 | Discharge: 2021-06-15 | Disposition: A | Discharge status: Home / Self Care | Payer: No Typology Code available for payment source | Attending: Emergency Medicine | Admitting: Emergency Medicine

## 2021-06-15 ENCOUNTER — Emergency Department: Payer: No Typology Code available for payment source

## 2021-06-15 DIAGNOSIS — R0789 Other chest pain: Secondary | ICD-10-CM | POA: Insufficient documentation

## 2021-06-15 DIAGNOSIS — R1012 Left upper quadrant pain: Secondary | ICD-10-CM

## 2021-06-15 DIAGNOSIS — N12 Tubulo-interstitial nephritis, not specified as acute or chronic: Secondary | ICD-10-CM | POA: Diagnosis not present

## 2021-06-15 DIAGNOSIS — R109 Unspecified abdominal pain: Secondary | ICD-10-CM | POA: Diagnosis present

## 2021-06-15 DIAGNOSIS — E039 Hypothyroidism, unspecified: Secondary | ICD-10-CM | POA: Diagnosis not present

## 2021-06-15 DIAGNOSIS — N23 Unspecified renal colic: Secondary | ICD-10-CM

## 2021-06-15 HISTORY — DX: Calculus of kidney: N20.0

## 2021-06-15 LAB — CBC
HCT: 36.7 % (ref 36.0–46.0)
Hemoglobin: 11.6 g/dL — ABNORMAL LOW (ref 12.0–15.0)
MCH: 26.7 pg (ref 26.0–34.0)
MCHC: 31.6 g/dL (ref 30.0–36.0)
MCV: 84.6 fL (ref 80.0–100.0)
Platelets: 266 10*3/uL (ref 150–400)
RBC: 4.34 MIL/uL (ref 3.87–5.11)
RDW: 13.3 % (ref 11.5–15.5)
WBC: 7.2 10*3/uL (ref 4.0–10.5)
nRBC: 0 % (ref 0.0–0.2)

## 2021-06-15 LAB — URINALYSIS, ROUTINE W REFLEX MICROSCOPIC
Bilirubin Urine: NEGATIVE
Glucose, UA: NEGATIVE mg/dL
Hgb urine dipstick: NEGATIVE
Ketones, ur: NEGATIVE mg/dL
Nitrite: POSITIVE — AB
Protein, ur: 100 mg/dL — AB
Specific Gravity, Urine: 1.028 (ref 1.005–1.030)
WBC, UA: >50 WBC/hpf — ABNORMAL HIGH (ref 0–5)
pH: 5 (ref 5.0–8.0)

## 2021-06-15 LAB — BASIC METABOLIC PANEL
Anion gap: 9 (ref 5–15)
BUN: 14 mg/dL (ref 8–23)
CO2: 26 mmol/L (ref 22–32)
Calcium: 9.4 mg/dL (ref 8.9–10.3)
Chloride: 106 mmol/L (ref 98–111)
Creatinine, Ser: 0.86 mg/dL (ref 0.44–1.00)
GFR, Estimated: >60 mL/min (ref >60)
Glucose, Bld: 128 mg/dL — ABNORMAL HIGH (ref 70–99)
Potassium: 3.5 mmol/L (ref 3.5–5.1)
Sodium: 141 mmol/L (ref 135–145)

## 2021-06-15 LAB — CBG MONITORING, ED: Glucose-Capillary: 118 mg/dL — ABNORMAL HIGH (ref 70–99)

## 2021-06-15 LAB — TROPONIN I (HIGH SENSITIVITY): Troponin I (High Sensitivity): 5 ng/L (ref <18)

## 2021-06-15 MED ORDER — CEFDINIR 300 MG PO CAPS: 300.0000 mg | ORAL_CAPSULE | Freq: Two times a day (BID) | ORAL | 0 refills | Status: AC

## 2021-06-15 MED ORDER — SODIUM CHLORIDE 0.9 % IV BOLUS
1000.0000 mL | Freq: Once | INTRAVENOUS | Status: AC
  Administered 2021-06-15: 1000 mL via INTRAVENOUS

## 2021-06-15 MED ORDER — IOHEXOL 350 MG/ML SOLN
75.0000 mL | Freq: Once | INTRAVENOUS | Status: AC | PRN
  Administered 2021-06-15: 75 mL via INTRAVENOUS

## 2021-06-15 MED ORDER — SODIUM CHLORIDE 0.9 % IV SOLN
1.0000 g | Freq: Once | INTRAVENOUS | Status: AC
  Administered 2021-06-15: 1 g via INTRAVENOUS
  Filled 2021-06-15: qty 10

## 2021-06-15 MED ORDER — MORPHINE SULFATE (PF) 4 MG/ML IV SOLN
4.0000 mg | Freq: Once | INTRAVENOUS | Status: AC
  Administered 2021-06-15: 4 mg via INTRAVENOUS
  Filled 2021-06-15 (×2): qty 1

## 2021-06-15 MED ORDER — ONDANSETRON HCL 4 MG/2ML IJ SOLN
4.0000 mg | Freq: Once | INTRAMUSCULAR | Status: AC
  Administered 2021-06-15: 4 mg via INTRAVENOUS
  Filled 2021-06-15 (×2): qty 2

## 2021-06-15 NOTE — ED Provider Notes (Signed)
? ?[facility] ?Provider Note ? ? ? Event Date/Time  ? First MD Initiated Contact with Patient 06/15/21 [phone]   ?  (approximate) ? ? ?History  ? ?Flank Pain and Near Syncope ? ? ?HPI ? ?Kaylann Behner is a 66 y.o. female with past medical history of prior kidney stones, hypothyroidism presents with right flank pain.  Symptoms started about 2 days ago.  Pain is on the right side.  Feels typical of her prior kidney stones.  Has been urinating more frequently but denies dysuria urgency.  Denies hematuria fever.  Has had nausea but no vomiting.  Denies abdominal pain.  Patient was seen by Dr. Sabra Heck in his office today.  Apparently was in quite amount of discomfort was rolling around on the floor and may be passed out.  He she was sent to the emergency department for further evaluation.  Denies lightheadedness or CP.  ?  ? ?Past Medical History:  ?Diagnosis Date  ? History of anemia   ? History of blood transfusion   ? History of hepatitis A   ? Hep A as a child  ? History of hyperlipidemia   ? History of hypothyroidism   ? Hypothyroidism   ? Iron deficiency anemia 04/22/2020  ? Kidney stones   ? ? ?Patient Active Problem List  ? Diagnosis Date Noted  ? Iron deficiency anemia 04/22/2020  ? DIARRHEA 04/13/2010  ? HYPOTHYROIDISM 07/06/2006  ? HYPERLIPIDEMIA 07/06/2006  ? ? ? ?Physical Exam  ?Triage Vital Signs: ?ED Triage Vitals  ?Enc Vitals Group  ?   BP 06/15/21 0932 (!) 103/59  ?   Pulse Rate 06/15/21 0932 60  ?   Resp 06/15/21 0932 15  ?   Temp 06/15/21 0935 (!) 97.5 ?F (36.4 ?C)  ?   Temp Source 06/15/21 0932 Oral  ?   SpO2 06/15/21 0932 100 %  ?   Weight 06/15/21 0932 122 lb (55.3 kg)  ?   Height 06/15/21 0932 5\' 2"  (1.575 m)  ?   Head Circumference --   ?   Peak Flow --   ?   Pain Score 06/15/21 0932 0  ?   Pain Loc --   ?   Pain Edu? --   ?   Excl. in [place]? --   ? ? ?Most recent vital signs: ?Vitals:  ? 06/15/21 0935 06/15/21 1045  ?BP:  120/74  ?Pulse:  77  ?Resp:  (!) 21  ?Temp: (!) 97.5 ?F  (36.4 ?C)   ?SpO2:  100%  ? ? ? ?General: Awake, appears uncomfortable  ?CV:  Good peripheral perfusion.  ?Resp:  Normal effort.  ?Abd:  No distention. Soft nontender ?Neuro:             Awake, Alert, Oriented x 3  ?Other:  + R CVA ttp ? ? ?ED Results / Procedures / Treatments  ?Labs ?(all labs ordered are listed, but only abnormal results are displayed) ?Labs Reviewed  ?URINALYSIS, ROUTINE W REFLEX MICROSCOPIC - Abnormal; Notable for the following components:  ?    Result Value  ? Color, Urine AMBER (*)   ? APPearance CLOUDY (*)   ? Protein, ur 100 (*)   ? Nitrite POSITIVE (*)   ? Leukocytes,Ua SMALL (*)   ? WBC, UA >50 (*)   ? Bacteria, UA MANY (*)   ? All other components within normal limits  ?CBC - Abnormal; Notable for the following components:  ? Hemoglobin 11.6 (*)   ?  All other components within normal limits  ?BASIC METABOLIC PANEL - Abnormal; Notable for the following components:  ? Glucose, Bld 128 (*)   ? All other components within normal limits  ?CBG MONITORING, ED - Abnormal; Notable for the following components:  ? Glucose-Capillary 118 (*)   ? All other components within normal limits  ?URINE CULTURE  ?TROPONIN I (HIGH SENSITIVITY)  ? ? ? ?EKG ? ?EKG interpretation performed by myself: NSR, nml axis, nml intervals, no acute ischemic changes ? ? ?RADIOLOGY ? ?Reviewed CT A/P which is negative for acute pathology ? ?PROCEDURES: ? ?Critical Care performed: No ? ?Procedures ? ?The patient is on the cardiac monitor to evaluate for evidence of arrhythmia and/or significant heart rate changes. ? ? ?MEDICATIONS ORDERED IN ED: ?Medications  ?sodium chloride 0.9 % bolus 1,000 mL (0 mLs Intravenous Stopped 06/15/21 1040)  ?morphine (PF) 4 MG/ML injection 4 mg (4 mg Intravenous Given 06/15/21 1043)  ?ondansetron (ZOFRAN) injection 4 mg (4 mg Intravenous Given 06/15/21 1044)  ?iohexol (OMNIPAQUE) 350 MG/ML injection 75 mL (75 mLs Intravenous Contrast Given 06/15/21 1058)  ?cefTRIAXone (ROCEPHIN) 1 g in sodium  chloride 0.9 % 100 mL IVPB (1 g Intravenous New Bag/Given 06/15/21 1201)  ? ? ? ?IMPRESSION / MDM / ASSESSMENT AND PLAN / ED COURSE  ?I reviewed the triage vital signs and the nursing notes. ?             ?               ? ?Differential diagnosis includes, but is not limited to, kidney stone, pyelonephritis  ? ? ?  ?Patient is a 66 year old female with history of kidney stones presents from PCP due to right-sided flank pain.  ? ?Symptoms have been going on and off since January.  Worsened since Sunday several days ago.  Seen by PCP this morning and apparently was on the floor and pain and questionable he had a syncopal episode.  Did not fall or hit her head.  She has some increased urinary output but otherwise no dysuria or hematuria.  Denies fevers chills chest pain.  Patient appears uncomfortable on my initial evaluation not wanting to move around.  Does have CVA tenderness abdomen is benign.  Will obtain CT renal study and labs.  UA pending treat her pain with morphine as she received Toradol in the office already.  We will give her a liter of fluid. ? ?CT renal study is negative for stone.  CBC and BMP are all reassuring.  Patient now complaining of chest pain on my evaluation, will obtain EKG and given the flank pain with no obvious intra-abdominal explanation we will obtain a CTA to rule out pulmonary embolism. ? ?CTA is negative for PE or other acute process.  UA does suggest potential UTI with greater than 50 WBCs.  Will send for urine culture.  In the setting of her flank pain we will treat as pyelonephritis.  She was given first dose of Rocephin in the ED will discharge with 7 days of cefdinir.  Patient not showing signs of sepsis tolerating p.o. I think she is appropriate for outpatient management.  Patient agreeable with the plan.  Discussed return precautions. ?FINAL CLINICAL IMPRESSION(S) / ED DIAGNOSES  ? ?Final diagnoses:  ?Flank pain  ?Pyelonephritis  ? ? ? ?Rx / DC Orders  ? ?ED Discharge Orders    ? ?      Ordered  ?  cefdinir (OMNICEF) 300 MG capsule  2 times  daily       ? 06/15/21 1220  ? ?  ?  ? ?  ? ? ? ?Note:  This document was prepared using Dragon voice recognition software and may include unintentional dictation errors. ?  ?Rada Hay, MD ?06/15/21 1245 ? ?

## 2021-06-15 NOTE — ED Triage Notes (Addendum)
Pt was sent from Dr Bethann Punches with c/o right flank pain with n/v since yesterday morning, states they were going to perform outpatient CT to check for kidney stones but the pt had near syncope in the office due to pain. Pt is pale and diaphoretic on arrival. Pt received 30mg  IM Toradol PTA ?

## 2021-06-15 NOTE — Discharge Instructions (Signed)
It does look like you have a UTI.  If you develop fevers despite being on antibiotics or unable to eat or drink please return to the emergency department.  Please follow-up with Dr. Hyacinth Meeker.  Please take the antibiotic twice a day for the next 7 days. ?

## 2021-06-16 LAB — URINE CULTURE

## 2022-03-23 ENCOUNTER — Other Ambulatory Visit: Payer: Self-pay | Admitting: Internal Medicine

## 2022-03-23 DIAGNOSIS — Z1231 Encounter for screening mammogram for malignant neoplasm of breast: Secondary | ICD-10-CM

## 2022-04-05 ENCOUNTER — Encounter: Payer: Self-pay | Admitting: Oncology

## 2022-04-06 IMAGING — CT CT RENAL STONE PROTOCOL
2 of 4 series · 16 of 46 positions shown, 18 images · non-contrast
Comparison: None.

CLINICAL DATA: Nephrolithiasis. Right flank pain. Nausea and
vomiting.



[Series 2: ap without · axial · non-contrast · 0.69mm/px · z∈[-1027,-652]mm · 13 of 85 slices shown, 15 images]
[im 5/85  soft-tissue]
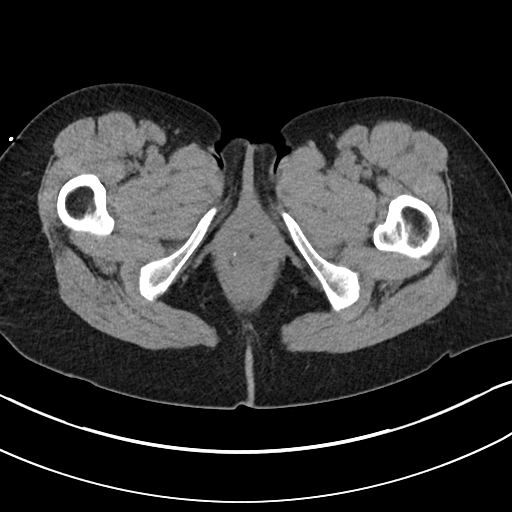
[im 5/85  bone]
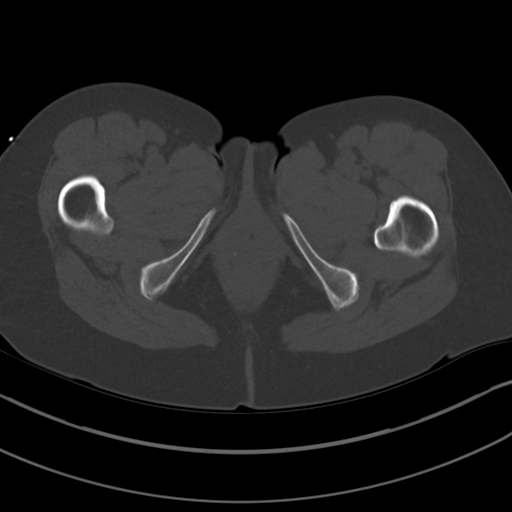
[im 10/85  soft-tissue]
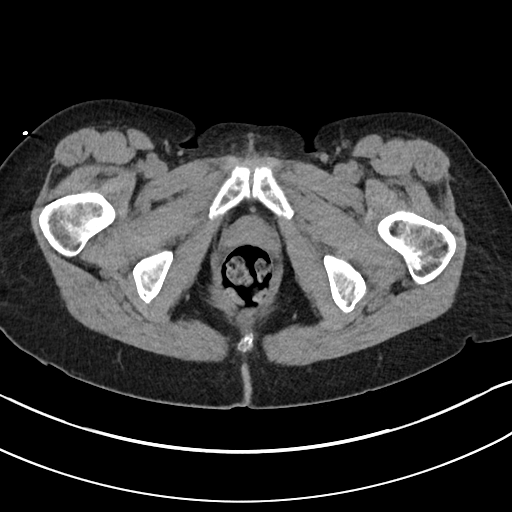
[im 19/85  soft-tissue]
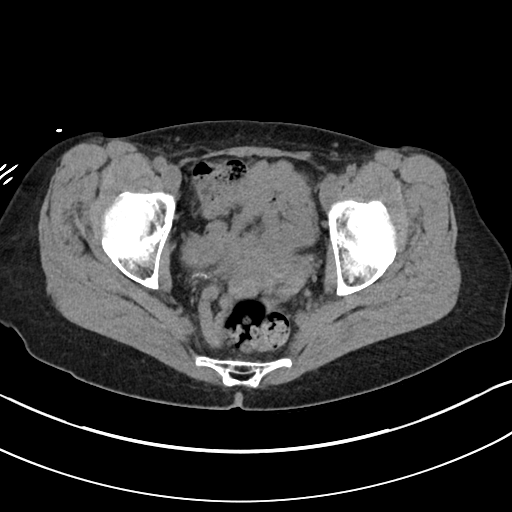
[im 24/85  soft-tissue]
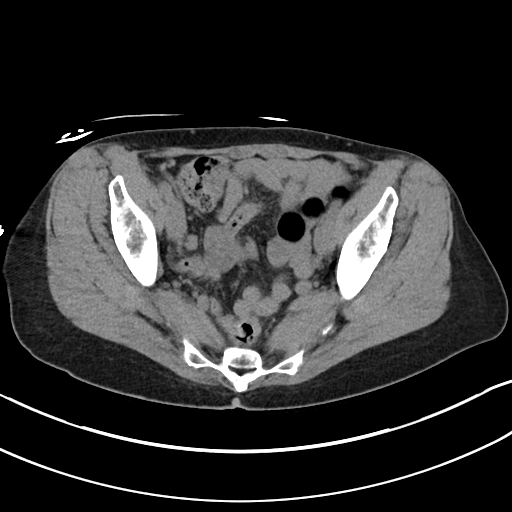
[im 29/85  soft-tissue]
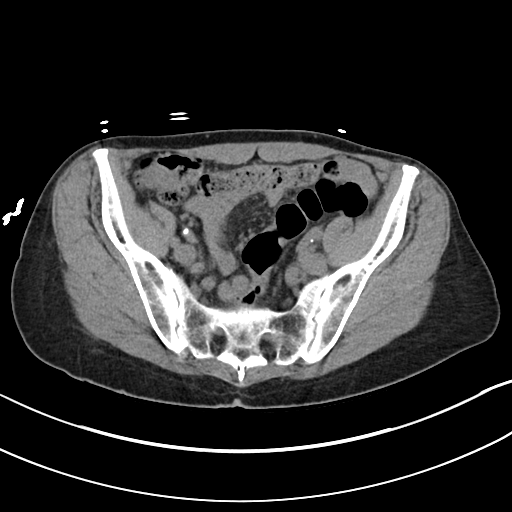
[im 38/85  soft-tissue]
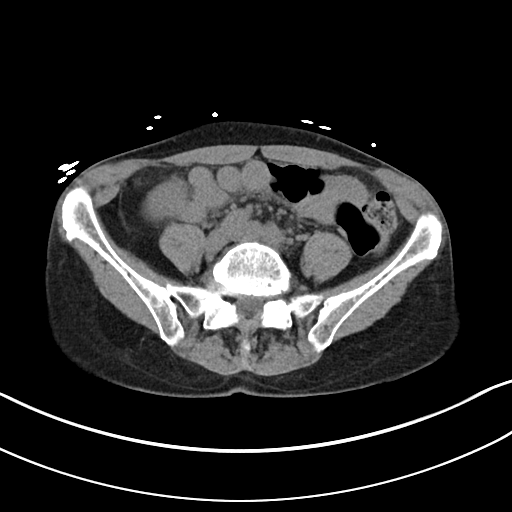
[im 43/85  soft-tissue]
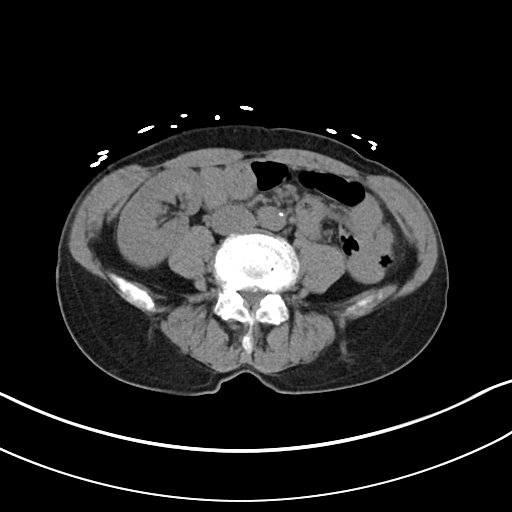
[im 47/85  soft-tissue]
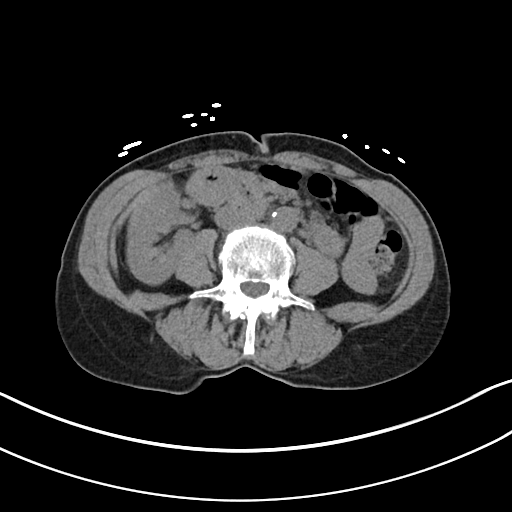
[im 57/85  soft-tissue]
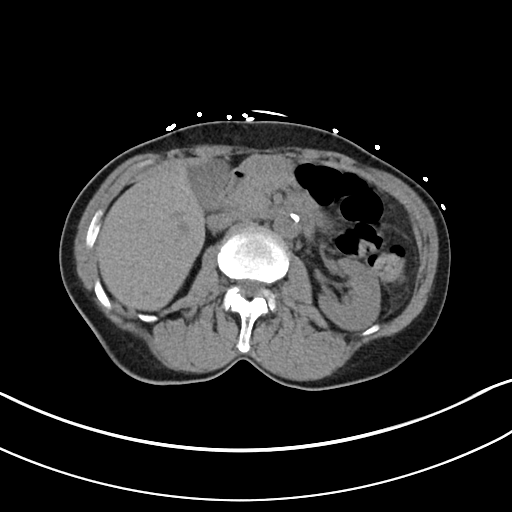
[im 57/85  bone]
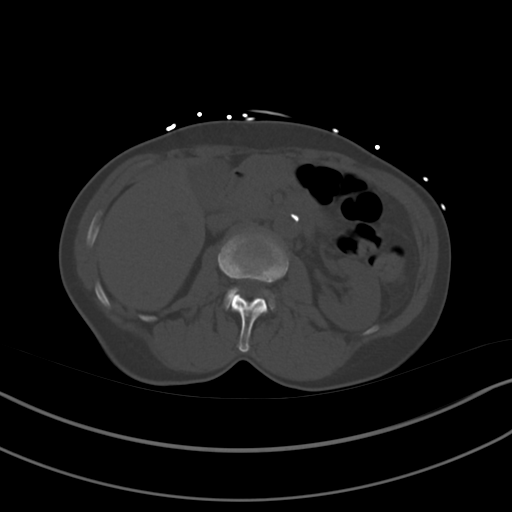
[im 61/85  soft-tissue]
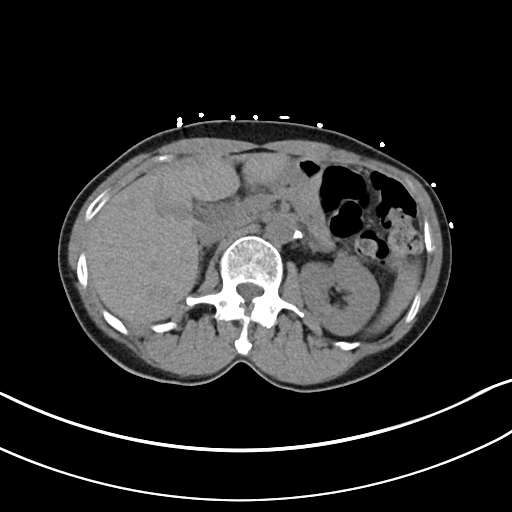
[im 66/85  soft-tissue]
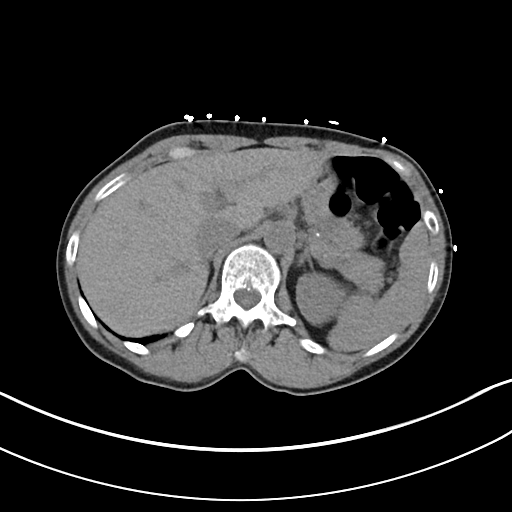
[im 75/85  soft-tissue]
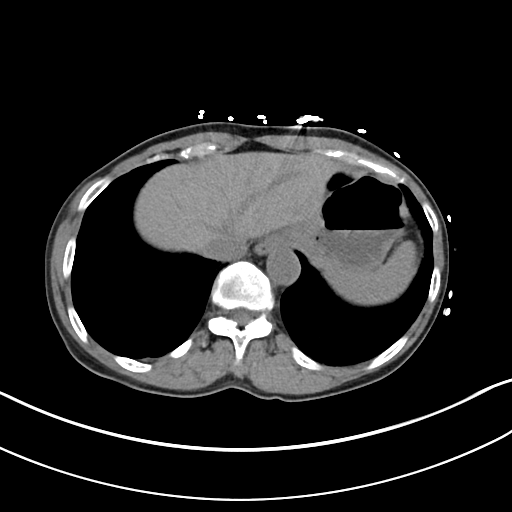
[im 80/85  soft-tissue]
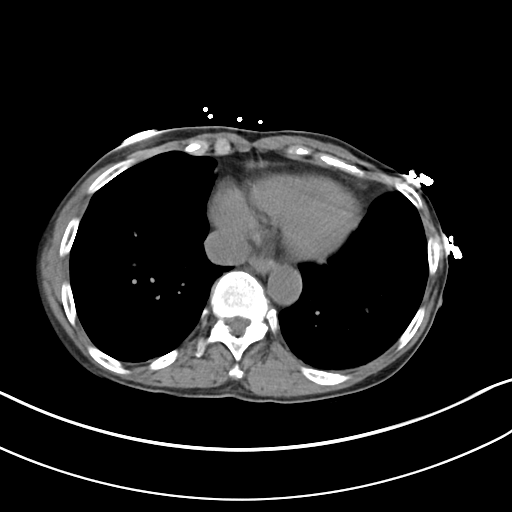

[Series 5: cor · coronal · 0.73mm/px · 3 of 79 slices shown]
[im 35/79  soft-tissue]
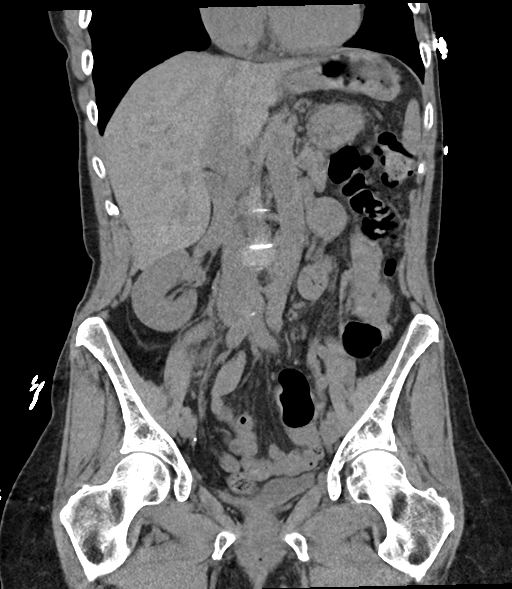
[im 44/79  soft-tissue]
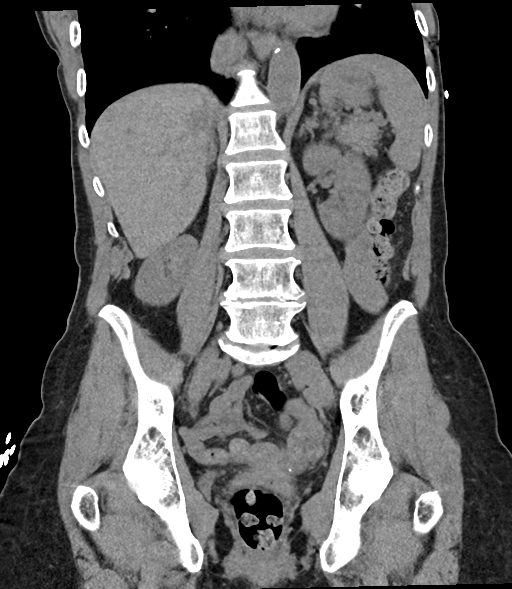
[im 53/79  soft-tissue]
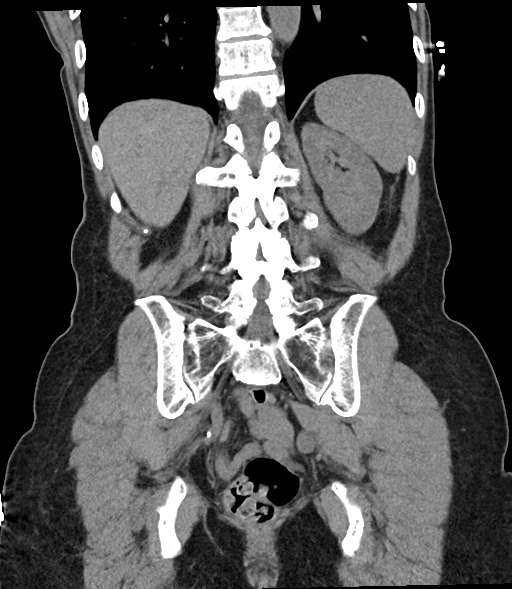

[16 of 46 positions shown; findings below may reference images not displayed]

FINDINGS: Lower chest: No acute abnormality.

Hepatobiliary: No focal liver abnormality is seen. No gallstones,
gallbladder wall thickening, or biliary dilatation.

Pancreas: Unremarkable. No pancreatic ductal dilatation or
surrounding inflammatory changes.

Spleen: Normal in size without focal abnormality.

Adrenals/Urinary Tract: Adrenal glands are unremarkable. Kidneys are
normal, without renal calculi, focal lesion, or hydronephrosis.
Bladder is unremarkable.

Stomach/Bowel: Stomach appears normal. The appendix is not
confidently identified separate from the right lower quadrant bowel
loops. No secondary signs of appendicitis noted however. No bowel
wall thickening, inflammation, or distension.

Vascular/Lymphatic: Aortic atherosclerosis. No enlarged abdominal or
pelvic lymph nodes.

Reproductive: The uterus is either surgically absent or atrophic. No
adnexal mass identified.

Other: No abdominal wall hernia or abnormality. No abdominopelvic
ascites.

Musculoskeletal: No acute or suspicious osseous findings. There is
degenerative disc disease identified at L4-5.
IMPRESSION: 1. No acute findings within the abdomen or pelvis. No
nephrolithiasis or hydronephrosis.
2. The appendix is not confidently identified separate from the
right lower quadrant bowel loops. No secondary signs of appendicitis
noted however.
3. Aortic Atherosclerosis ([id number]-[id number]).

## 2022-04-06 IMAGING — CT CT ANGIO CHEST
2 of 7 series · 18 of 46 positions shown · IV contrast (APPLIED)
Comparison: CT abdomen same day

CLINICAL DATA: Pulmonary embolism suspected, high probability.
Right chest and flank pain beginning yesterday. Near syncopal
episode.

EXAM:
CT ANGIOGRAPHY CHEST WITH CONTRAST
TECHNIQUE: Multidetector CT imaging of the chest was performed using the
standard protocol during bolus administration of intravenous
contrast. Multiplanar CT image reconstructions and MIPs were
obtained to evaluate the vascular anatomy.

[Series 5: thins · axial · 0.68mm/px · z∈[-275,+1]mm · 15 of 385 slices shown]
[im 20/385  lung]
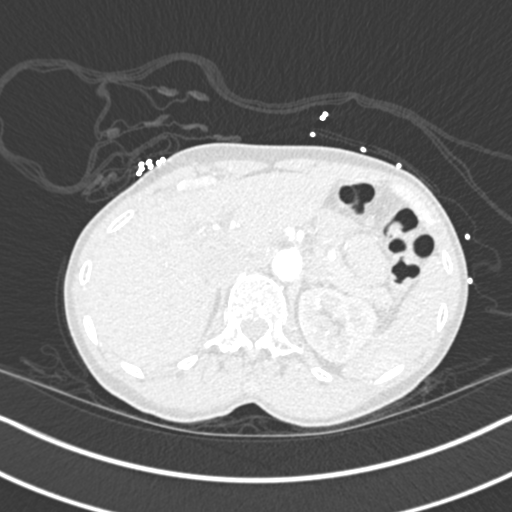
[im 39/385  soft-tissue]
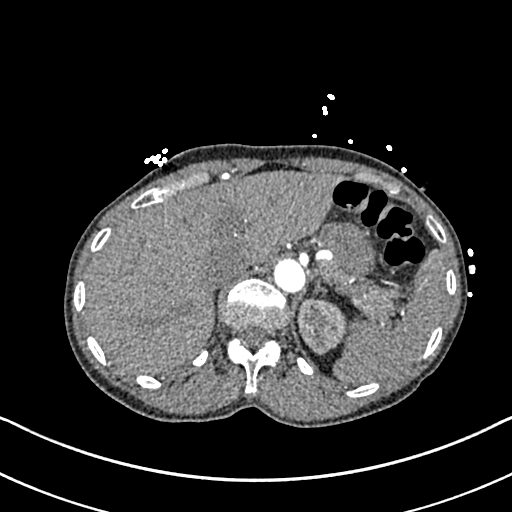
[im 77/385  lung]
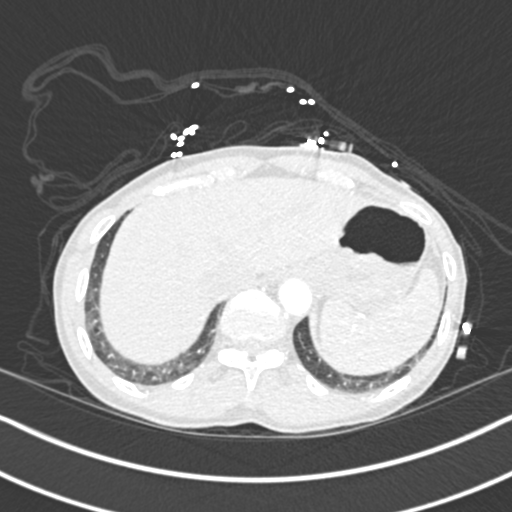
[im 97/385  soft-tissue]
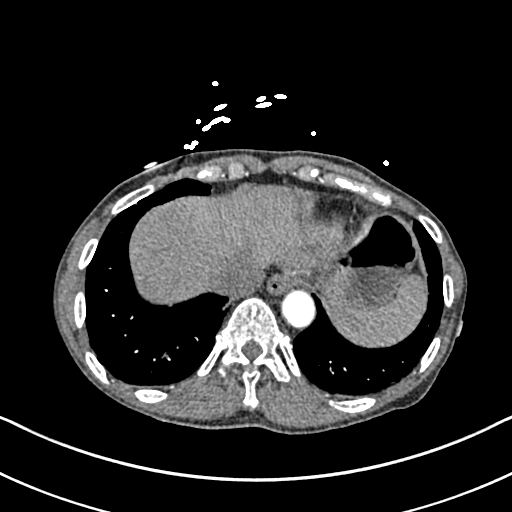
[im 116/385  lung]
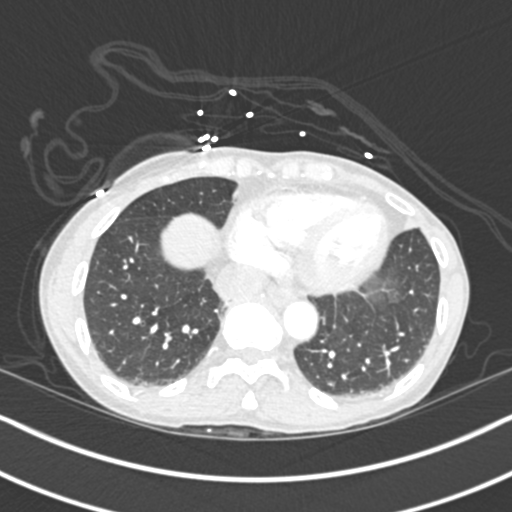
[im 135/385  soft-tissue]
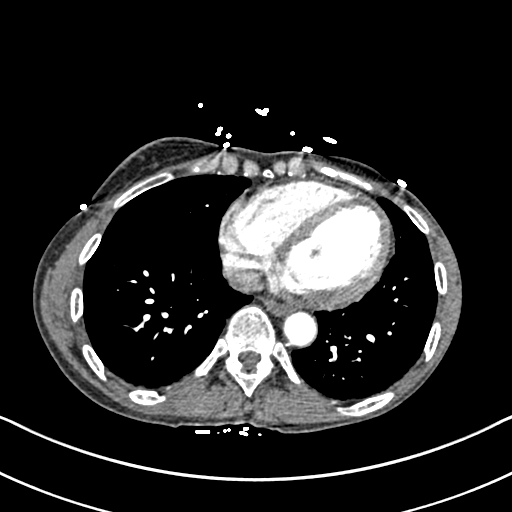
[im 173/385  lung]
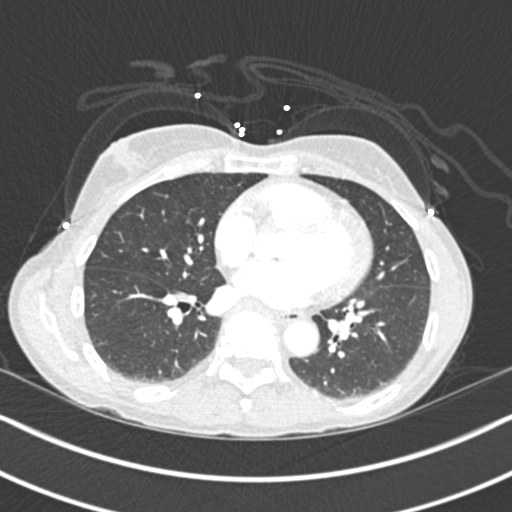
[im 193/385  soft-tissue]
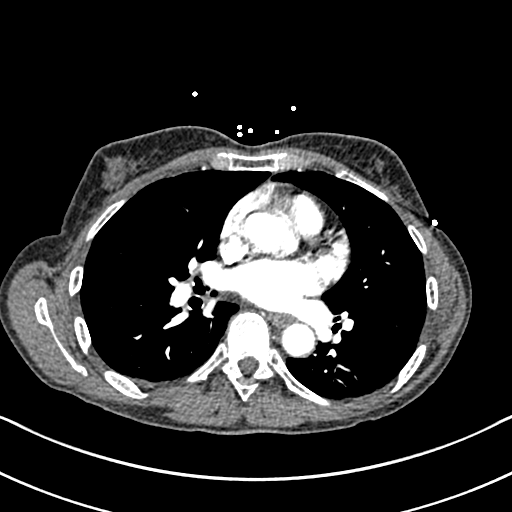
[im 212/385  lung]
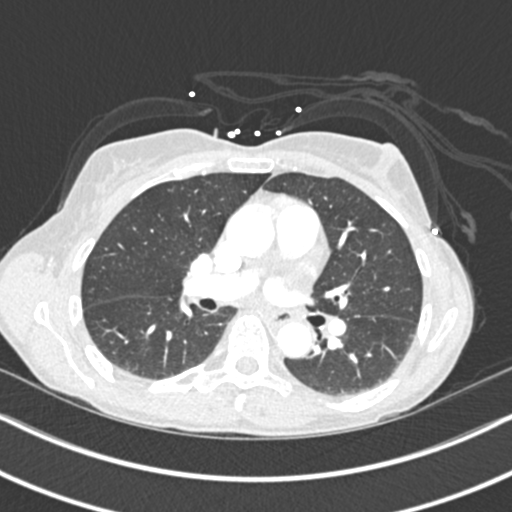
[im 250/385  soft-tissue]
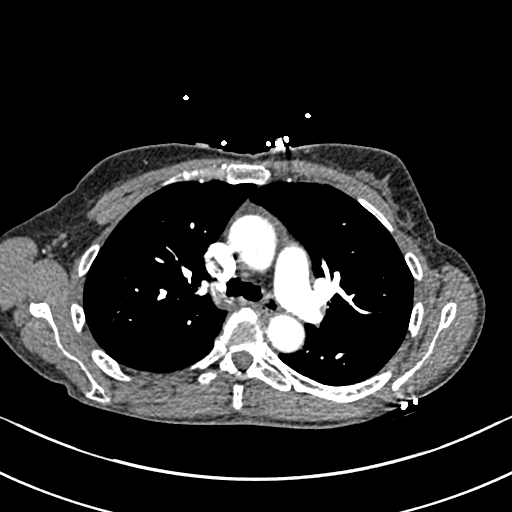
[im 269/385  lung]
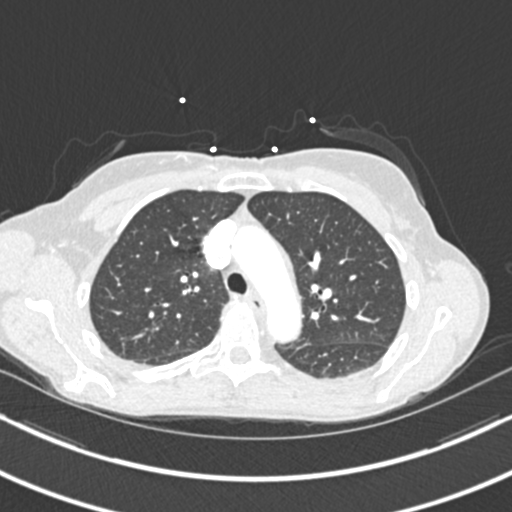
[im 289/385  soft-tissue]
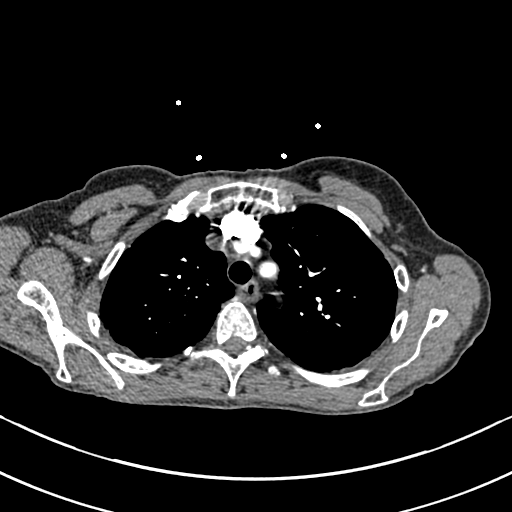
[im 308/385  lung]
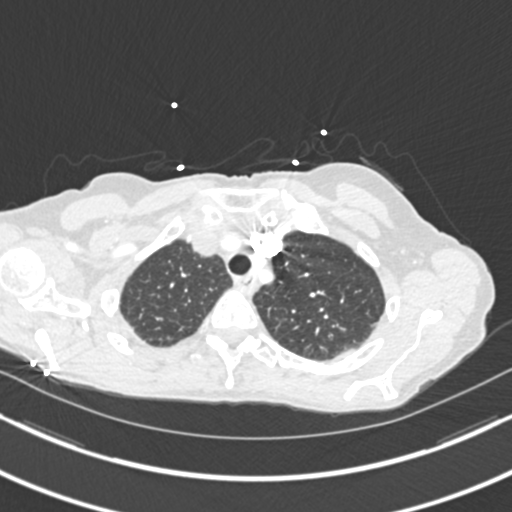
[im 346/385  soft-tissue]
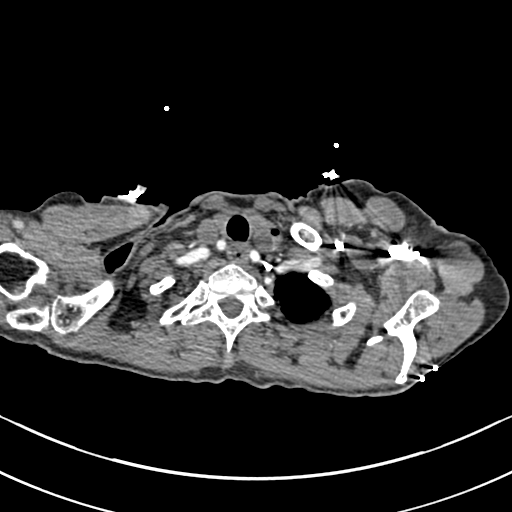
[im 365/385  lung]
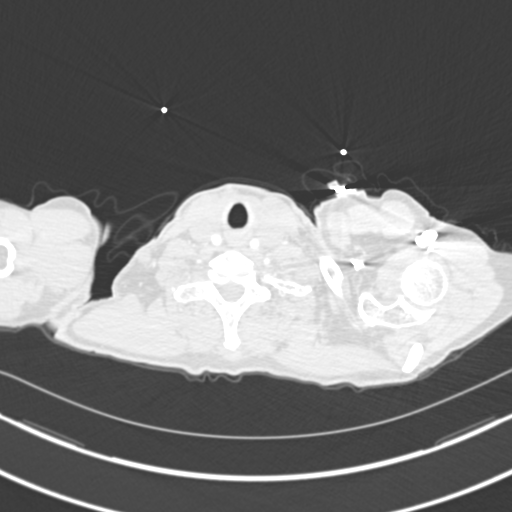

[Series 7: coronal mpr · coronal · 0.60mm/px · 3 of 104 slices shown]
[im 26/104  soft-tissue]
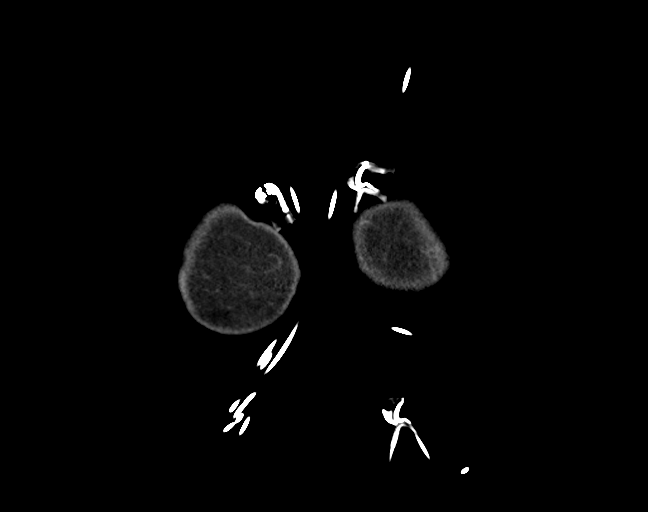
[im 52/104  soft-tissue]
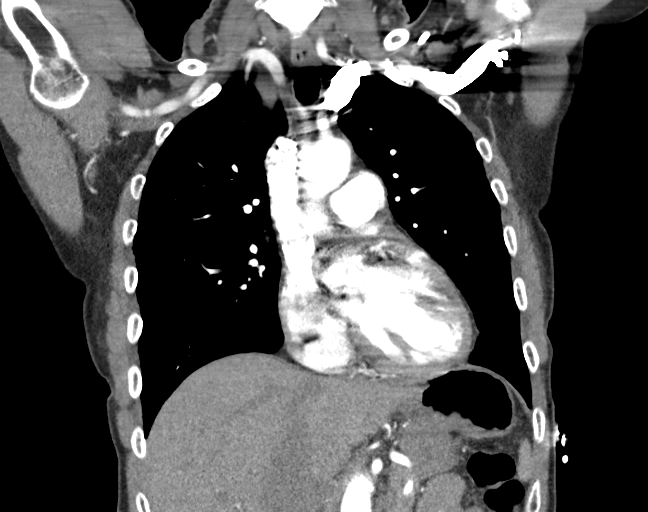
[im 78/104  soft-tissue]
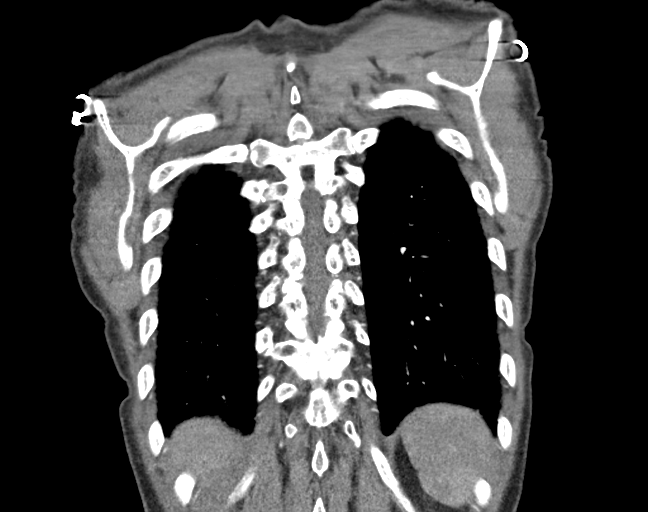

[18 of 46 positions shown; findings below may reference images not displayed]

RADIATION DOSE REDUCTION: This exam was performed according to the
departmental dose-optimization program which includes automated
exposure control, adjustment of the mA and/or kV according to
patient size and/or use of iterative reconstruction technique.

CONTRAST:  75mL OMNIPAQUE IOHEXOL 350 MG/ML SOLN
FINDINGS: Cardiovascular: Heart size is normal. No pericardial effusion. No
visible coronary artery calcification or aortic atherosclerotic
calcification. Pulmonary arterial opacification is good. There are
no pulmonary emboli.

Mediastinum/Nodes: No mass or adenopathy.

Lungs/Pleura: The lungs are clear. No infiltrate, collapse,
effusion, nodule or mass.

Upper Abdomen: Negative.  See results abdominal CT.

Musculoskeletal: Minimal thoracic degenerative changes. Sternum and
ribs appear normal.

Review of the MIP images confirms the above findings.
IMPRESSION: Normal CT scan of the chest. No pulmonary emboli or other chest
pathology.

## 2022-04-12 ENCOUNTER — Ambulatory Visit
Admission: RE | Admit: 2022-04-12 | Discharge: 2022-04-12 | Disposition: A | Discharge status: Home / Self Care | Payer: Medicare HMO | Source: Ambulatory Visit | Attending: Internal Medicine | Admitting: Internal Medicine

## 2022-04-12 DIAGNOSIS — Z1231 Encounter for screening mammogram for malignant neoplasm of breast: Secondary | ICD-10-CM | POA: Insufficient documentation

## 2022-08-26 ENCOUNTER — Encounter: Payer: Self-pay | Admitting: Oncology

## 2022-08-29 ENCOUNTER — Other Ambulatory Visit: Payer: Self-pay

## 2022-08-29 DIAGNOSIS — Z021 Encounter for pre-employment examination: Secondary | ICD-10-CM

## 2022-08-29 NOTE — Progress Notes (Signed)
Pt completed/ cleared pre-employment UDS.

## 2023-03-17 ENCOUNTER — Other Ambulatory Visit: Payer: Self-pay | Admitting: Internal Medicine

## 2023-03-17 DIAGNOSIS — Z1231 Encounter for screening mammogram for malignant neoplasm of breast: Secondary | ICD-10-CM

## 2023-04-10 ENCOUNTER — Encounter: Payer: Self-pay | Admitting: Oncology

## 2023-04-17 ENCOUNTER — Ambulatory Visit
Admission: RE | Admit: 2023-04-17 | Discharge: 2023-04-17 | Disposition: A | Discharge status: Home / Self Care | Payer: Medicare Other | Source: Ambulatory Visit | Attending: Internal Medicine | Admitting: Internal Medicine

## 2023-04-17 DIAGNOSIS — Z1231 Encounter for screening mammogram for malignant neoplasm of breast: Secondary | ICD-10-CM | POA: Diagnosis present

## 2023-05-13 ENCOUNTER — Emergency Department (HOSPITAL_BASED_OUTPATIENT_CLINIC_OR_DEPARTMENT_OTHER): Payer: Medicare Other | Admitting: Radiology

## 2023-05-13 ENCOUNTER — Emergency Department (HOSPITAL_BASED_OUTPATIENT_CLINIC_OR_DEPARTMENT_OTHER)
Admission: EM | Admit: 2023-05-13 | Discharge: 2023-05-13 | Disposition: A | Discharge status: Home / Self Care | Payer: Medicare Other

## 2023-05-13 ENCOUNTER — Encounter (HOSPITAL_BASED_OUTPATIENT_CLINIC_OR_DEPARTMENT_OTHER): Payer: Self-pay | Admitting: Emergency Medicine

## 2023-05-13 DIAGNOSIS — R531 Weakness: Secondary | ICD-10-CM | POA: Insufficient documentation

## 2023-05-13 DIAGNOSIS — R030 Elevated blood-pressure reading, without diagnosis of hypertension: Secondary | ICD-10-CM | POA: Insufficient documentation

## 2023-05-13 DIAGNOSIS — Z79899 Other long term (current) drug therapy: Secondary | ICD-10-CM | POA: Diagnosis not present

## 2023-05-13 DIAGNOSIS — M549 Dorsalgia, unspecified: Secondary | ICD-10-CM | POA: Diagnosis not present

## 2023-05-13 DIAGNOSIS — E039 Hypothyroidism, unspecified: Secondary | ICD-10-CM | POA: Insufficient documentation

## 2023-05-13 DIAGNOSIS — R110 Nausea: Secondary | ICD-10-CM | POA: Diagnosis not present

## 2023-05-13 LAB — BASIC METABOLIC PANEL
Anion gap: 10 (ref 5–15)
BUN: 18 mg/dL (ref 8–23)
CO2: 30 mmol/L (ref 22–32)
Calcium: 10 mg/dL (ref 8.9–10.3)
Chloride: 99 mmol/L (ref 98–111)
Creatinine, Ser: 0.77 mg/dL (ref 0.44–1.00)
GFR, Estimated: >60 mL/min (ref >60)
Glucose, Bld: 82 mg/dL (ref 70–99)
Potassium: 3.8 mmol/L (ref 3.5–5.1)
Sodium: 139 mmol/L (ref 135–145)

## 2023-05-13 LAB — CBC
HCT: 41.2 % (ref 36.0–46.0)
Hemoglobin: 13.5 g/dL (ref 12.0–15.0)
MCH: 28 pg (ref 26.0–34.0)
MCHC: 32.8 g/dL (ref 30.0–36.0)
MCV: 85.3 fL (ref 80.0–100.0)
Platelets: 311 10*3/uL (ref 150–400)
RBC: 4.83 MIL/uL (ref 3.87–5.11)
RDW: 14 % (ref 11.5–15.5)
WBC: 5.4 10*3/uL (ref 4.0–10.5)
nRBC: 0 % (ref 0.0–0.2)

## 2023-05-13 LAB — TROPONIN I (HIGH SENSITIVITY): Troponin I (High Sensitivity): 4 ng/L (ref <18)

## 2023-05-13 NOTE — ED Notes (Signed)
 Dc instructions reviewed with patient. Patient voiced understanding. Dc with belongings.

## 2023-05-13 NOTE — ED Triage Notes (Signed)
 Wednesday episode of weakness, nausea "I was down all day"  Checked BP and BP was high 170/99  Some back pain between shoulder blades..   Concerned because she is going to travel this week  Was rxd losartan by PCP and has taken it for 2 days Some sob with normal Activity

## 2023-05-13 NOTE — Discharge Instructions (Signed)
 Your workup today was reassuring, I did not see any evidence of damage to your heart, kidneys, stroke or other effects of your high blood pressure.  It is possible that you do need to be on an antihypertensive medication long-term versus he may have short-term elevation of your blood pressure secondary to possibly a short viral infection that she had earlier this week which could have been the reason that she felt poorly.  If you have return or worsening of your dizziness, develop chest pain, shortness of breath I recommend that you return for further evaluation and management.

## 2023-05-13 NOTE — ED Provider Notes (Signed)
 [place] EMERGENCY DEPARTMENT AT [facility] Provider Note   CSN: [id number] Arrival date & time: 05/13/23  1400     History  Chief Complaint  Patient presents with   Hypertension   Shortness of Breath    Jean Pena is a 68 y.o. female with pmh significant for hypothyroidism, HLD, who presents with concern for 1 day of generalized weakness, nausea, and elevated blood pressure.  She denies any chest pain, shortness of breath.  She endorses some back pain between her shoulder blades.  She reports decreased appetite for the last couple of days.  She is mostly concerned because [place] and wants to ensure that there is nothing that she needs to worry about.  Her doctor placed her on low-dose losartan yesterday which she has taken 2 doses of.   Hypertension Associated symptoms include shortness of breath.  Shortness of Breath      Home Medications Prior to Admission medications   Medication Sig Start Date End Date Taking? Authorizing Provider  cetirizine (ZYRTEC) 10 MG tablet Take 10 mg by mouth daily.    [provider]  diphenhydrAMINE (BENADRYL) 25 MG tablet Take 25 mg by mouth every 6 (six) hours as needed. Patient not taking: Reported on 06/15/2021    [provider]  levothyroxine (SYNTHROID) 88 MCG tablet Take 88 mcg by mouth every morning. 05/14/20   [provider]  meloxicam (MOBIC) 7.5 MG tablet Take 7.5 mg by mouth daily.    [provider]      Allergies    Patient has no known allergies.    Review of Systems   Review of Systems  Respiratory:  Positive for shortness of breath.   All other systems reviewed and are negative.   Physical Exam Updated Vital Signs BP (!) 152/85   Pulse 79   Temp 98.1 F (36.7 C)   Resp 14   SpO2 97%  Physical Exam Vitals and nursing note reviewed.  Constitutional:      General: She is not in acute distress.    Appearance: Normal appearance.  HENT:      Head: Normocephalic and atraumatic.  Eyes:     General:        Right eye: No discharge.        Left eye: No discharge.  Cardiovascular:     Rate and Rhythm: Normal rate and regular rhythm.     Heart sounds: No murmur heard.    No friction rub. No gallop.  Pulmonary:     Effort: Pulmonary effort is normal.     Breath sounds: Normal breath sounds.  Abdominal:     General: Bowel sounds are normal.     Palpations: Abdomen is soft.  Skin:    General: Skin is warm and dry.     Capillary Refill: Capillary refill takes less than 2 seconds.  Neurological:     Mental Status: She is alert and oriented to person, place, and time.     Comments: Moves all 4 limbs spontaneously, CN II through XII grossly intact, can ambulate without difficulty, intact sensation throughout.  Psychiatric:        Mood and Affect: Mood normal.        Behavior: Behavior normal.     ED Results / Procedures / Treatments   Labs (all labs ordered are listed, but only abnormal results are displayed) Labs Reviewed  BASIC METABOLIC PANEL  CBC  TROPONIN I (HIGH SENSITIVITY)  TROPONIN  I (HIGH SENSITIVITY)    EKG None  Radiology DG Chest 2 View Result Date: 05/13/2023 CLINICAL DATA:  Chest pain and shortness of breath. EXAM: CHEST - 2 VIEW COMPARISON:  CT chest 06/15/2021 FINDINGS: Cardiac silhouette and mediastinal contours are within normal limits. The lungs are clear. No pleural effusion or pneumothorax. Mild-to-moderate multilevel degenerative disc changes of the thoracic spine. IMPRESSION: No active cardiopulmonary disease. Electronically Signed   By: Jean Pena M.D.   On: 05/13/2023 15:23    Procedures Procedures    Medications Ordered in ED Medications - No data to display  ED Course/ Medical Decision Making/ A&P                                 Medical Decision Making Amount and/or Complexity of Data Reviewed Labs: ordered. Radiology: ordered.   Medical Decision Making:   Jean Pena is a  68 y.o. female who presented to the ED today with high blood pressure, mild dizziness, without chest pain detailed above.    External chart has been reviewed including previous family medicine visits. Patient's presentation is complicated by their history of hypothyroidism, hyperlipidemia, anemia.  Patient placed on continuous vitals and telemetry monitoring while in ED which was reviewed periodically.  Complete initial physical exam performed, notably the patient  was somewhat hypertensive in the ED, blood pressure 152/85, vital signs otherwise stable, no focal neurologic deficits, patient able to stand and ambulate without any difficulty.    Reviewed and confirmed nursing documentation for past medical history, family history, social history.    Initial Assessment:   With the patient's presentation of elevated blood pressure readings, most likely diagnosis is hypertensive urgency. Other diagnoses associated with hypertensive emergency were considered including (but not limited to) intracranial hemorrhage, acute renal artery stenosis, acute kidney injury, myocardial stress, ophthalmologic emergencies. These are considered less likely due to history of present illness and physical exam findings.   This is most consistent with an acute life/limb threatening illness complicated by underlying chronic conditions. Will evaluate for hypertensive emergency as below.  Initial Plan:   Screening labs including CBC and Metabolic panel to evaluate for infectious or metabolic etiology of disease.  CXR to evaluate for structural/infectious intrathoracic pathology.  EKG to evaluate for cardiac pathology. Objective evaluation as below reviewed. Considered further administration of antihypertensives in ED, per consensus guidelines for [facility] of emergency physicians, acute treatment of hypertensive urgency alone in the emergency department is not recommended.  If patient has evidence of hypertensive  emergency on objective laboratory evaluation, will reevaluate.  Will monitor blood pressure while patient awaiting above laboratory studies.  Initial Study Results:   Laboratory  All laboratory results reviewed without evidence of clinically relevant pathology.   Exceptions include: n/a, all umremarkable  EKG EKG was reviewed independently. Rate, rhythm, axis, intervals all examined and without medically relevant abnormality. ST segments without concerns for elevations.    Radiology:  All images reviewed independently. Agree with radiology report at this time.   DG Chest 2 View Result Date: 05/13/2023 CLINICAL DATA:  Chest pain and shortness of breath. EXAM: CHEST - 2 VIEW COMPARISON:  CT chest 06/15/2021 FINDINGS: Cardiac silhouette and mediastinal contours are within normal limits. The lungs are clear. No pleural effusion or pneumothorax. Mild-to-moderate multilevel degenerative disc changes of the thoracic spine. IMPRESSION: No active cardiopulmonary disease. Electronically Signed   By: Jean Pena M.D.   On: 05/13/2023 15:23  MM 3D SCREENING MAMMOGRAM BILATERAL BREAST Result Date: 04/19/2023 CLINICAL DATA:  Screening. EXAM: DIGITAL SCREENING BILATERAL MAMMOGRAM WITH TOMOSYNTHESIS AND CAD TECHNIQUE: Bilateral screening digital craniocaudal and mediolateral oblique mammograms were obtained. Bilateral screening digital breast tomosynthesis was performed. The images were evaluated with computer-aided detection. COMPARISON:  Previous exam(s). ACR Breast Density Category c: The breasts are heterogeneously dense, which may obscure small masses. FINDINGS: There are no findings suspicious for malignancy. IMPRESSION: No mammographic evidence of malignancy. A result letter of this screening mammogram will be mailed directly to the patient. RECOMMENDATION: Screening mammogram in one year. (Code:SM-B-01Y) BI-RADS CATEGORY  1: Negative. Electronically Signed   By: Frederico Hamman M.D.   On: 04/19/2023  07:26    Reassessment and Plan:   Well-appearing with stable vital signs other than some mild hypertension, blood pressure 152/85 in the emergency department, no active chest pain, no focal neurologic deficits, and unremarkable workup  Final Clinical Impression(s) / ED Diagnoses Final diagnoses:  Elevated blood pressure reading    Rx / DC Orders ED Discharge Orders     None         Olene Floss, PA-C 05/13/23 1617    Royanne Foots, DO 05/14/23 1222

## 2023-05-14 ENCOUNTER — Emergency Department (HOSPITAL_BASED_OUTPATIENT_CLINIC_OR_DEPARTMENT_OTHER)
Admission: EM | Admit: 2023-05-14 | Discharge: 2023-05-14 | Disposition: A | Discharge status: Home / Self Care | Payer: Medicare Other

## 2023-05-14 ENCOUNTER — Other Ambulatory Visit: Payer: Self-pay

## 2023-05-14 ENCOUNTER — Emergency Department (HOSPITAL_BASED_OUTPATIENT_CLINIC_OR_DEPARTMENT_OTHER): Payer: Medicare Other

## 2023-05-14 ENCOUNTER — Encounter (HOSPITAL_BASED_OUTPATIENT_CLINIC_OR_DEPARTMENT_OTHER): Payer: Self-pay | Admitting: Emergency Medicine

## 2023-05-14 DIAGNOSIS — M546 Pain in thoracic spine: Secondary | ICD-10-CM | POA: Insufficient documentation

## 2023-05-14 DIAGNOSIS — I10 Essential (primary) hypertension: Secondary | ICD-10-CM | POA: Insufficient documentation

## 2023-05-14 DIAGNOSIS — E039 Hypothyroidism, unspecified: Secondary | ICD-10-CM | POA: Insufficient documentation

## 2023-05-14 DIAGNOSIS — Z79890 Hormone replacement therapy (postmenopausal): Secondary | ICD-10-CM | POA: Insufficient documentation

## 2023-05-14 LAB — HEPATIC FUNCTION PANEL
ALT: 18 U/L (ref 0–44)
AST: 25 U/L (ref 15–41)
Albumin: 4.8 g/dL (ref 3.5–5.0)
Alkaline Phosphatase: 87 U/L (ref 38–126)
Bilirubin, Direct: 0.1 mg/dL (ref 0.0–0.2)
Indirect Bilirubin: 0.9 mg/dL (ref 0.3–0.9)
Total Bilirubin: 1 mg/dL (ref 0.0–1.2)
Total Protein: 7.3 g/dL (ref 6.5–8.1)

## 2023-05-14 LAB — TROPONIN I (HIGH SENSITIVITY): Troponin I (High Sensitivity): 4 ng/L (ref <18)

## 2023-05-14 LAB — URINALYSIS, ROUTINE W REFLEX MICROSCOPIC
Bilirubin Urine: NEGATIVE
Glucose, UA: NEGATIVE mg/dL
Hgb urine dipstick: NEGATIVE
Leukocytes,Ua: NEGATIVE
Nitrite: NEGATIVE
Protein, ur: NEGATIVE mg/dL
Specific Gravity, Urine: 1.015 (ref 1.005–1.030)
pH: 7 (ref 5.0–8.0)

## 2023-05-14 LAB — LIPASE, BLOOD: Lipase: 16 U/L (ref 11–51)

## 2023-05-14 MED ORDER — FENTANYL CITRATE PF 50 MCG/ML IJ SOSY
12.5000 ug | PREFILLED_SYRINGE | Freq: Once | INTRAMUSCULAR | Status: AC
  Administered 2023-05-14: 12.5 ug via INTRAVENOUS
  Filled 2023-05-14: qty 1

## 2023-05-14 MED ORDER — LIDOCAINE 5 % EX PTCH
1.0000 | MEDICATED_PATCH | CUTANEOUS | Status: DC
  Administered 2023-05-14: 1 via TRANSDERMAL
  Filled 2023-05-14: qty 1

## 2023-05-14 MED ORDER — METHOCARBAMOL 500 MG PO TABS: 500.0000 mg | ORAL_TABLET | Freq: Two times a day (BID) | ORAL | 0 refills | Status: AC | PRN

## 2023-05-14 MED ORDER — CELECOXIB 200 MG PO CAPS: 200.0000 mg | ORAL_CAPSULE | Freq: Two times a day (BID) | ORAL | 0 refills | Status: AC | PRN

## 2023-05-14 MED ORDER — IOHEXOL 350 MG/ML SOLN
100.0000 mL | Freq: Once | INTRAVENOUS | Status: AC | PRN
  Administered 2023-05-14: 75 mL via INTRAVENOUS

## 2023-05-14 NOTE — ED Triage Notes (Signed)
 Pt caox4, ambulatory c/o back pain that has been going on since being in the ED yesterday. Pt reports she was eval for HTN in ED yest. Pt last took muscle relaxer last night for pain without relief.

## 2023-05-14 NOTE — Discharge Instructions (Addendum)
 As discussed, your workup today was overall reassuring.  Your heart enzyme is normal.  The rest your blood work also appeared normal.  The CT scan of your chest and abdomen appeared normal.  Does not appear that your aorta is causing this or anything else significant inside the chest or abdomen.  Suspect it is most likely muscular.  Will send you home with anti-inflammatory to take for the back pain as well as muscle laxer to use as needed.  The muscle laxer can cause Drowsiness/confusion, so do not drive or perform a any high risk activity until you realize its effects on you.  Please do not hesitate to return if the worrisome signs and symptoms we discussed become apparent.

## 2023-05-14 NOTE — ED Provider Notes (Signed)
 [place] EMERGENCY DEPARTMENT AT [facility] Provider Note   CSN: [id number] Arrival date & time: 05/14/23  4132     History  No chief complaint on file.   Jean Pena is a 68 y.o. female.  HPI   68 year old female presents emergency department with complaints of right thoracic back pain.  States the symptoms began yesterday mildly when she was in the emergency department due to elevated blood pressure.  States the pain has been constant and has increasingly worsened since onset yesterday.  Patient states that she has a history of kidney stones and states that the pain feels somewhat similar although higher than where she was having pain in the past related to kidney stone.  States she has been trying ice, heat, compression, over-the-counter medications without significant improvement prompting visit emergency department.  Also states her blood pressure has continued to be elevated since being seen yesterday in the 160s/170s.  Was recently placed on blood pressure medication 2 days or so in the form of losartan due to elevated blood pressure by her primary care.  Denies any pain in her chest, cough, congestion, abdominal pain, nausea, vomiting, urinary symptoms, change in bowel habits.  Denies any history of PE/DVT, recent surgery/immobilization, known coagulopathy, no malignancy, hormonal therapy.  Denies any worsening symptoms with consumption of food/liquids.  Past medical history significant for hypertension, hypothyroidism, iron deficiency anemia, hyperlipidemia, kidney stone  Home Medications Prior to Admission medications   Medication Sig Start Date End Date Taking? Authorizing Provider  cetirizine (ZYRTEC) 10 MG tablet Take 10 mg by mouth daily.    [provider]  diphenhydrAMINE (BENADRYL) 25 MG tablet Take 25 mg by mouth every 6 (six) hours as needed. Patient not taking: Reported on 06/15/2021    [provider]  levothyroxine (SYNTHROID) 88 MCG  tablet Take 88 mcg by mouth every morning. 05/14/20   [provider]  meloxicam (MOBIC) 7.5 MG tablet Take 7.5 mg by mouth daily.    [provider]      Allergies    Patient has no known allergies.    Review of Systems   Review of Systems  All other systems reviewed and are negative.   Physical Exam Updated Vital Signs There were no vitals taken for this visit. Physical Exam Vitals and nursing note reviewed.  Constitutional:      General: She is not in acute distress.    Appearance: She is well-developed.  HENT:     Head: Normocephalic and atraumatic.  Eyes:     Conjunctiva/sclera: Conjunctivae normal.  Cardiovascular:     Rate and Rhythm: Normal rate and regular rhythm.     Pulses: Normal pulses.  Pulmonary:     Effort: Pulmonary effort is normal. No respiratory distress.     Breath sounds: Normal breath sounds. No wheezing, rhonchi or rales.  Abdominal:     Palpations: Abdomen is soft.     Tenderness: There is no abdominal tenderness.  Musculoskeletal:        General: No swelling.       Arms:     Cervical back: Neck supple.     Comments: Patient's reported pain in areas above.  No reproducible tenderness.  Skin:    General: Skin is warm and dry.     Capillary Refill: Capillary refill takes less than 2 seconds.  Neurological:     Mental Status: She is alert.  Psychiatric:        Mood and Affect: Mood normal.  ED Results / Procedures / Treatments   Labs (all labs ordered are listed, but only abnormal results are displayed) Labs Reviewed - No data to display  EKG None  Radiology DG Chest 2 View Result Date: 05/13/2023 CLINICAL DATA:  Chest pain and shortness of breath. EXAM: CHEST - 2 VIEW COMPARISON:  CT chest 06/15/2021 FINDINGS: Cardiac silhouette and mediastinal contours are within normal limits. The lungs are clear. No pleural effusion or pneumothorax. Mild-to-moderate multilevel degenerative disc changes of the thoracic spine.  IMPRESSION: No active cardiopulmonary disease. Electronically Signed   By: Neita Garnet M.D.   On: 05/13/2023 15:23    Procedures Procedures    Medications Ordered in ED Medications - No data to display  ED Course/ Medical Decision Making/ A&P                                 Medical Decision Making  This patient presents to the ED for concern of back pain, this involves an extensive number of treatment options, and is a complaint that carries with it a high risk of complications and morbidity.  The differential diagnosis includes fracture, strain/pain, dislocation, ligamentous/tendinous injury, pyelonephritis, nephrolithiasis, cauda equina, spinal epidural abscess, AAA, aortic dissection, GERD, pancreatitis, other   Co morbidities that complicate the patient evaluation  See HPI   Additional history obtained:  Additional history obtained from EMR External records from outside source obtained and reviewed including hospital records   Lab Tests:  I Ordered, and personally interpreted labs.  The pertinent results include: Troponin of 4.  UA with many bacteria, trace ketones otherwise unremarkable.  No transaminitis.  Lipase within normal limits.   Imaging Studies ordered:  I ordered imaging studies including CT dissection study I independently visualized and interpreted imaging which showed no acute abnormality I agree with the radiologist interpretation   Cardiac Monitoring: / EKG:  The patient was maintained on a cardiac monitor.  I personally viewed and interpreted the cardiac monitored which showed an underlying rhythm of: Sinus rhythm   Consultations Obtained:  N/a   Problem List / ED Course / Critical interventions / Medication management  Back pain I ordered medication including fentanyl, Lidoderm   Reevaluation of the patient after these medicines showed that the patient improved I have reviewed the patients home medicines and have made adjustments as  needed   Social Determinants of Health:  Denies tobacco, licit drug use.   Test / Admission - Considered:  Thoracic back pain Vitals signs significant for initial hypertension blood pressure 176/94 of which improved to 140/90. Otherwise within normal range and stable throughout visit. Laboratory/imaging studies significant for: See above 68 year old female presents emergency department complaints of right-sided thoracic back pain that began yesterday and is worsened since onset.  On exam, area just infrascapular right side paraspinally in thoracic region.  No obvious pulse deficits on exam.  Given patient's hypertension in the setting of right sided thoracic back pain that was not reproducible on exam, CT dissection study was ordered.  Dissection study negative for any acute abnormality.  Low suspicion for dissection.  Patient without risk factors for PE without tachycardia, tachypnea, hypoxia; no appreciable large PE on CT dissection study; low suspicion for PE.  Patient with negative troponin, lack of acute ischemic change on EKG; low suspicion for ACS.  Given duration of time since symptom onset, second troponin deemed unnecessary.  Hepatic function as well as lipase normal.  Patient's  symptoms not worsened or exacerbated with consumption of food/liquids; low suspicion for PUD/DUD.  Suspect musculoskeletal cause of patient's pain.  Will treat with NSAIDs and recommend follow-up with PCP in the outpatient setting.  Treatment plan discussed with patient and she acknowledged understanding was agreeable to said plan.  Patient overall well-appearing, afebrile in no acute distress. Worrisome signs and symptoms were discussed with the patient, and the patient acknowledged understanding to return to the ED if noticed. Patient was stable upon discharge.          Final Clinical Impression(s) / ED Diagnoses Final diagnoses:  None    Rx / DC Orders ED Discharge Orders     None          Peter Garter, [place] 05/14/23 1132    Coral Spikes, DO 05/14/23 1511

## 2024-02-21 ENCOUNTER — Ambulatory Visit
Admission: RE | Admit: 2024-02-21 | Discharge: 2024-02-21 | Disposition: A | Source: Ambulatory Visit | Attending: Internal Medicine | Admitting: Internal Medicine

## 2024-02-21 ENCOUNTER — Other Ambulatory Visit: Payer: Self-pay | Admitting: Internal Medicine

## 2024-02-21 DIAGNOSIS — S22060A Wedge compression fracture of T7-T8 vertebra, initial encounter for closed fracture: Secondary | ICD-10-CM

## 2024-02-22 ENCOUNTER — Other Ambulatory Visit: Payer: Self-pay | Admitting: Internal Medicine

## 2024-02-22 DIAGNOSIS — Z0000 Encounter for general adult medical examination without abnormal findings: Secondary | ICD-10-CM

## 2024-02-22 DIAGNOSIS — E782 Mixed hyperlipidemia: Secondary | ICD-10-CM

## 2024-03-04 ENCOUNTER — Inpatient Hospital Stay
Admission: RE | Admit: 2024-03-04 | Discharge: 2024-03-04 | Payer: Self-pay | Attending: Internal Medicine | Admitting: Internal Medicine

## 2024-03-04 DIAGNOSIS — E782 Mixed hyperlipidemia: Secondary | ICD-10-CM

## 2024-03-04 DIAGNOSIS — Z0000 Encounter for general adult medical examination without abnormal findings: Secondary | ICD-10-CM | POA: Insufficient documentation

## 2024-03-25 ENCOUNTER — Other Ambulatory Visit: Payer: Self-pay | Admitting: Internal Medicine

## 2024-03-25 ENCOUNTER — Encounter: Payer: Self-pay | Admitting: Oncology

## 2024-03-25 DIAGNOSIS — Z1231 Encounter for screening mammogram for malignant neoplasm of breast: Secondary | ICD-10-CM

## 2024-04-22 ENCOUNTER — Encounter

## 2024-05-03 ENCOUNTER — Encounter
